# Patient Record
Sex: Female | Born: 1970
Health system: Southern US, Community
[De-identification: ages and names within clinical notes are randomized; demographics above are authoritative.]

## PROBLEM LIST (undated history)

## (undated) DIAGNOSIS — E063 Autoimmune thyroiditis: Secondary | ICD-10-CM

## (undated) DIAGNOSIS — F419 Anxiety disorder, unspecified: Secondary | ICD-10-CM

## (undated) DIAGNOSIS — J45909 Unspecified asthma, uncomplicated: Secondary | ICD-10-CM

## (undated) DIAGNOSIS — F32A Depression, unspecified: Secondary | ICD-10-CM

## (undated) DIAGNOSIS — T7840XA Allergy, unspecified, initial encounter: Secondary | ICD-10-CM

## (undated) DIAGNOSIS — F329 Major depressive disorder, single episode, unspecified: Secondary | ICD-10-CM

## (undated) HISTORY — DX: Allergy, unspecified, initial encounter: T78.40XA

## (undated) HISTORY — PX: ANKLE SURGERY: SHX546

## (undated) HISTORY — DX: Anxiety disorder, unspecified: F41.9

## (undated) HISTORY — DX: Major depressive disorder, single episode, unspecified: F32.9

## (undated) HISTORY — DX: Unspecified asthma, uncomplicated: J45.909

## (undated) HISTORY — DX: Depression, unspecified: F32.A

## (undated) HISTORY — DX: Autoimmune thyroiditis: E06.3

---

## 2008-09-04 ENCOUNTER — Emergency Department (HOSPITAL_COMMUNITY): Admission: EM | Admit: 2008-09-04 | Discharge: 2008-09-04 | Payer: Self-pay | Admitting: Emergency Medicine

## 2009-09-07 IMAGING — CR DG ANKLE COMPLETE 3+V*L*
3 series · 3 of 3 positions shown · non-contrast
Comparison: None

CLINICAL DATA: Fall with left ankle injury and pain.

LEFT ANKLE COMPLETE - 3+ VIEW

[view not recorded (1 of 3)]
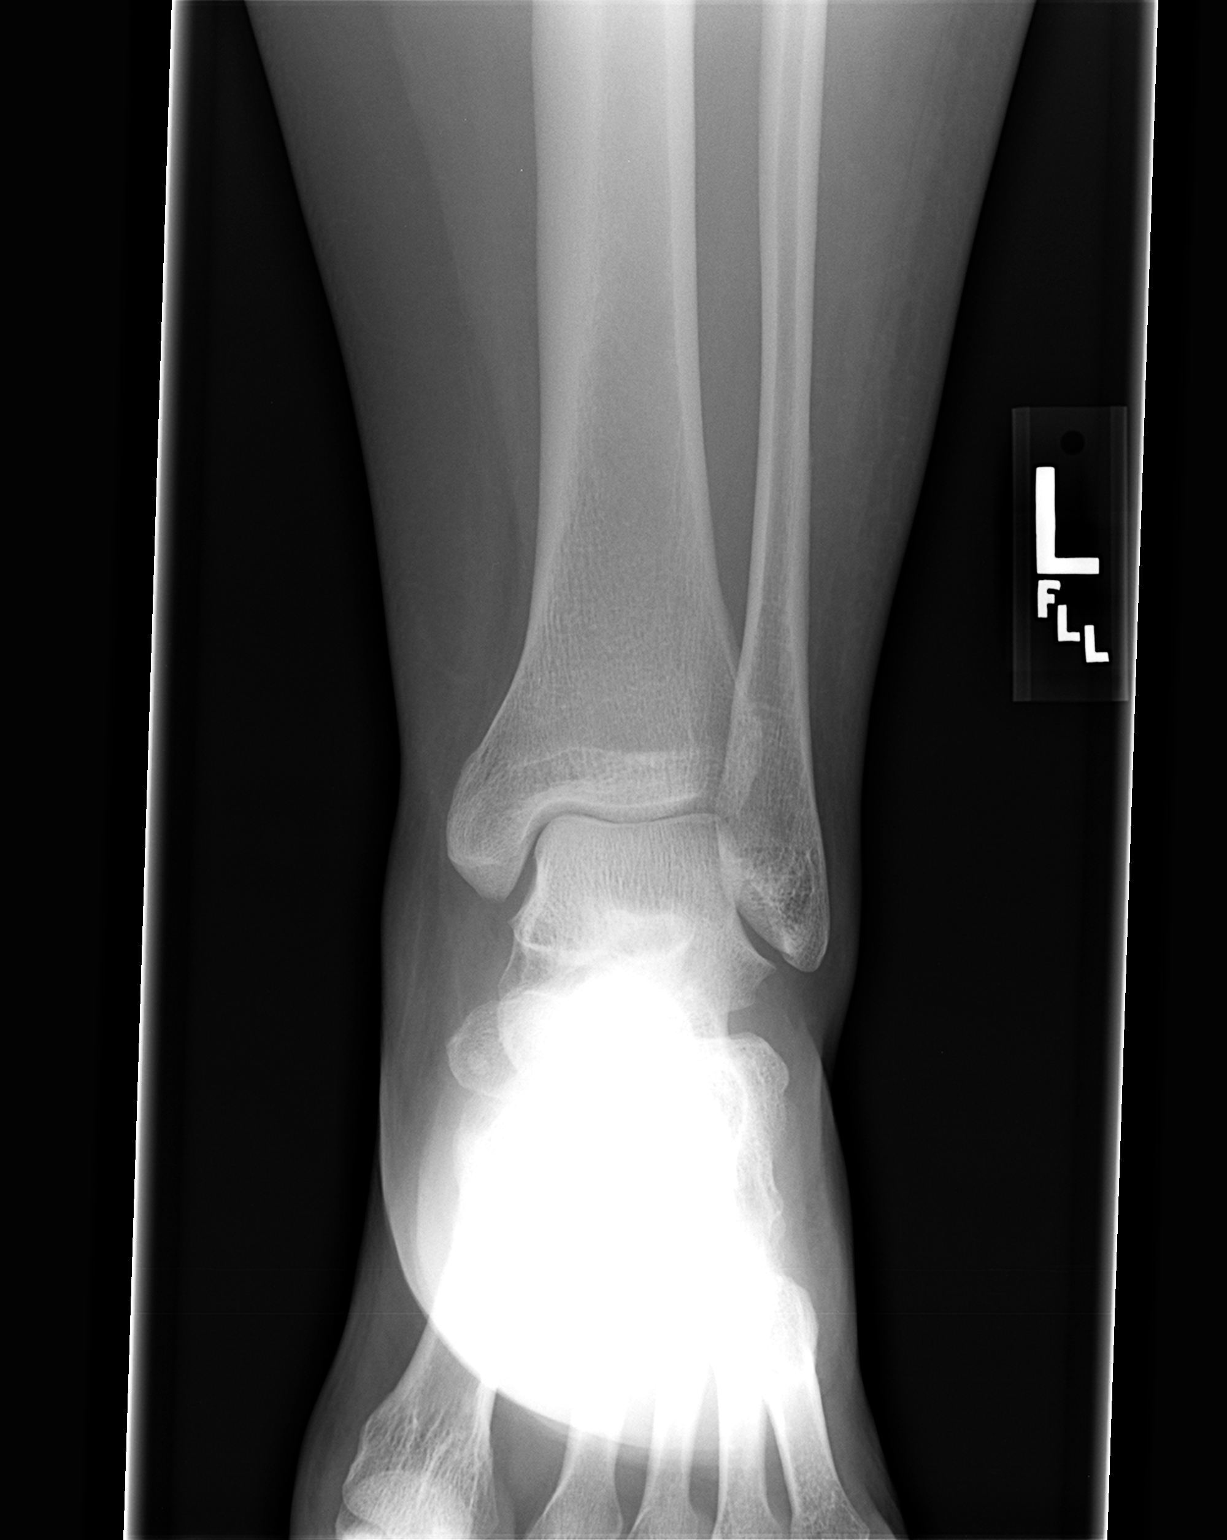

[view not recorded (2 of 3)]
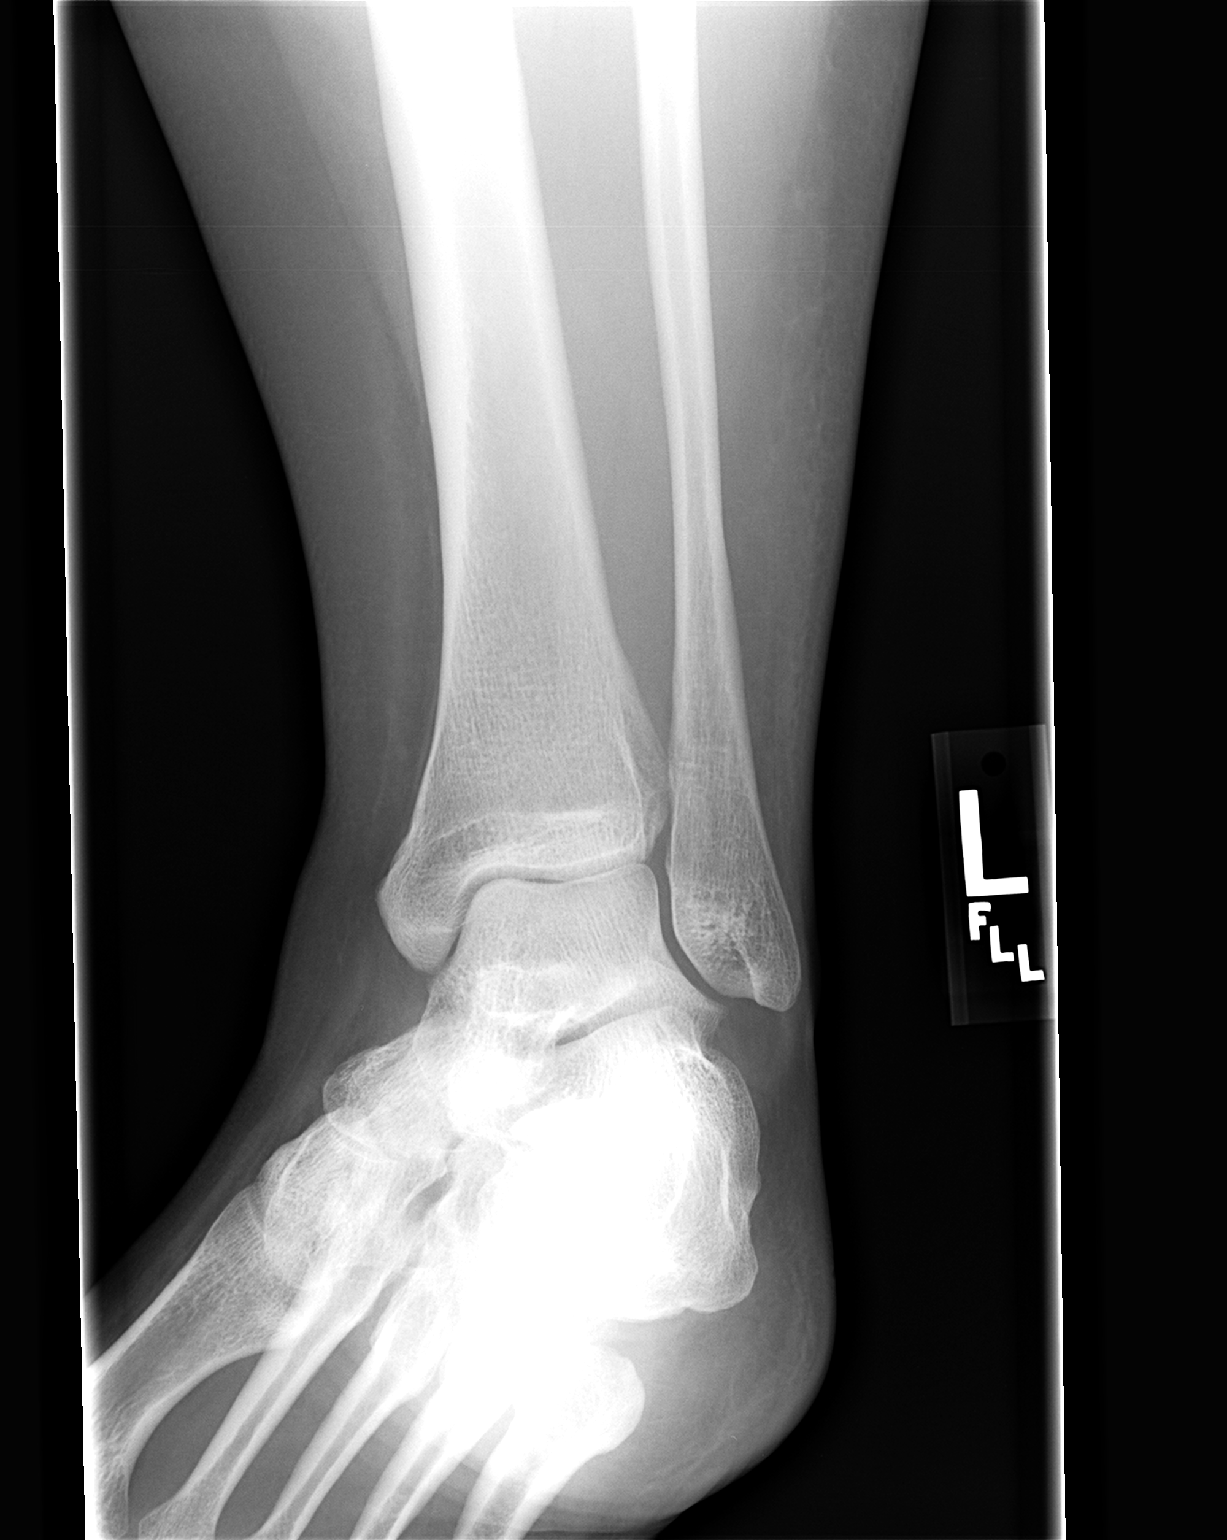

[view not recorded (3 of 3)]
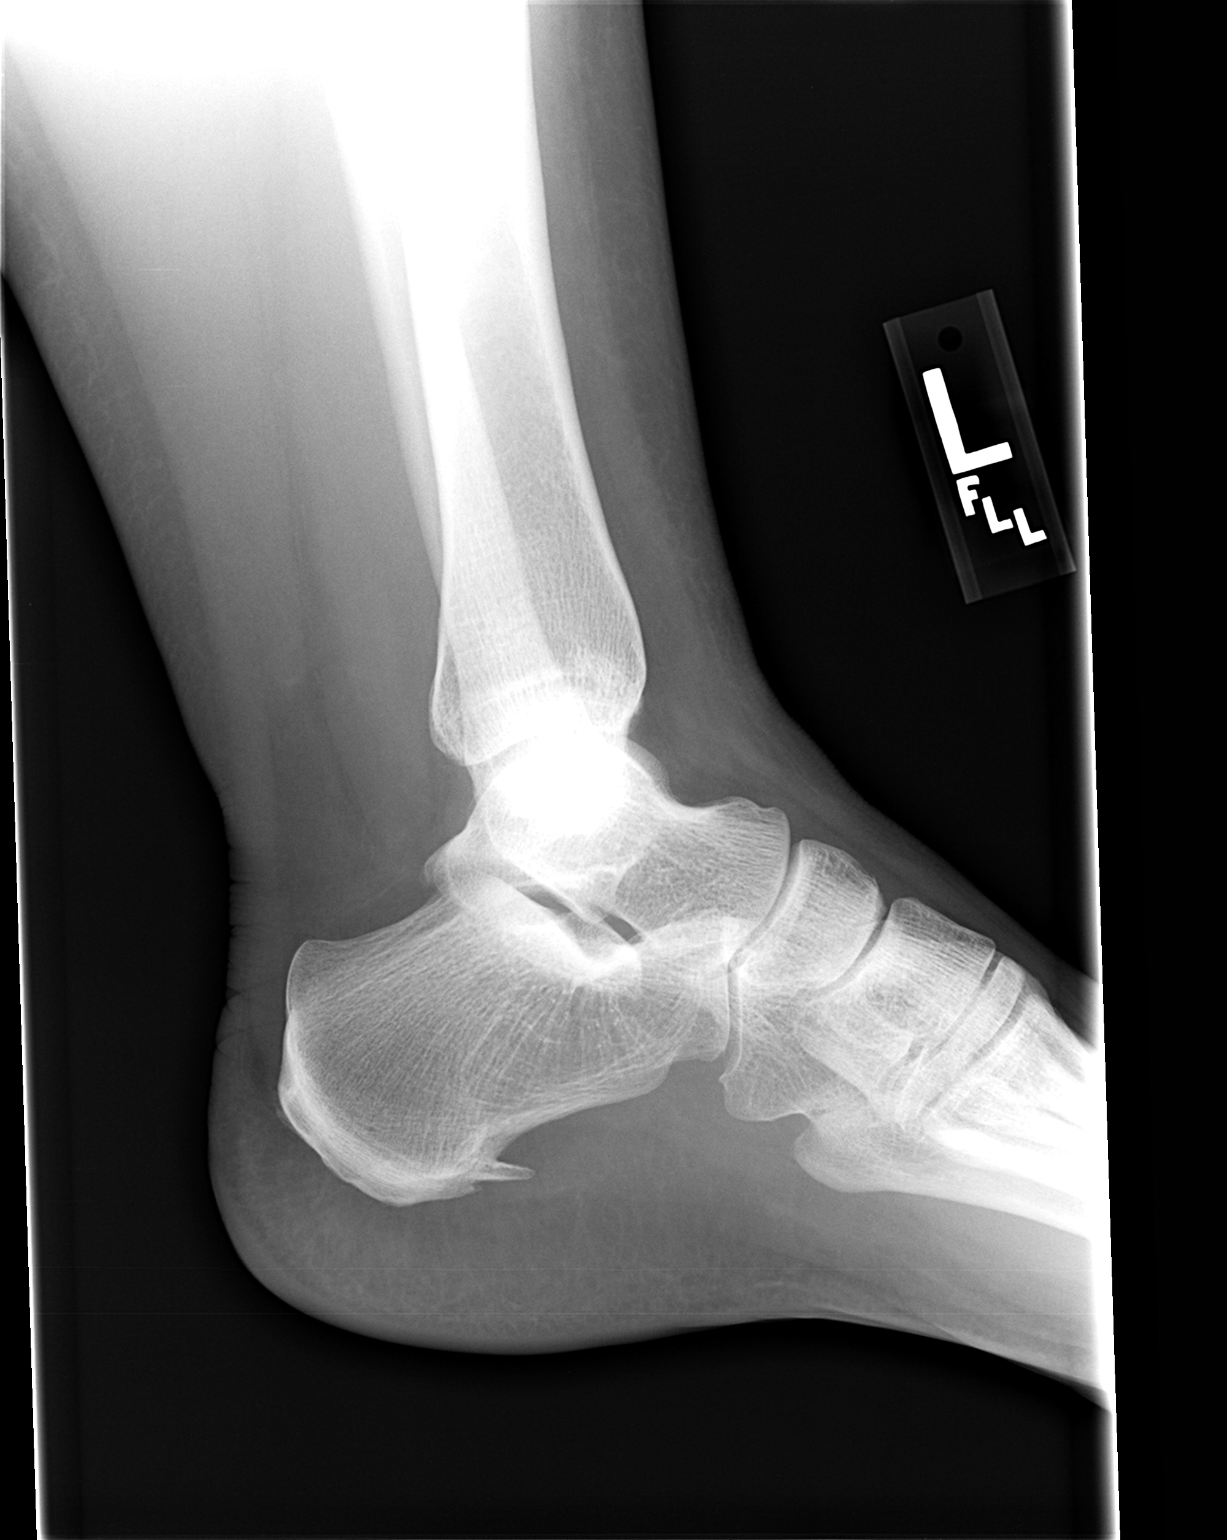

[3 of 3 positions shown; findings below may reference images not displayed]

FINDINGS: No evidence of acute bony abnormality including fracture,
subluxation, or dislocation.
There is no evidence of ankle effusion.
No radiopaque foreign bodies are noted.
A calcaneal spur is identified.
IMPRESSION: No evidence of acute bony abnormality.

Calcaneal spur.

## 2015-08-07 HISTORY — PX: OOPHORECTOMY: SHX86

## 2016-07-31 ENCOUNTER — Ambulatory Visit: Payer: No Typology Code available for payment source | Attending: Internal Medicine | Admitting: Physical Therapy

## 2016-07-31 ENCOUNTER — Encounter: Payer: Self-pay | Admitting: Physical Therapy

## 2016-07-31 DIAGNOSIS — M722 Plantar fascial fibromatosis: Secondary | ICD-10-CM | POA: Diagnosis present

## 2016-07-31 DIAGNOSIS — M6281 Muscle weakness (generalized): Secondary | ICD-10-CM | POA: Diagnosis not present

## 2016-07-31 DIAGNOSIS — R200 Anesthesia of skin: Secondary | ICD-10-CM | POA: Insufficient documentation

## 2016-07-31 DIAGNOSIS — R278 Other lack of coordination: Secondary | ICD-10-CM | POA: Insufficient documentation

## 2016-07-31 NOTE — Patient Instructions (Signed)
You are now ready to begin training the deep core muscles system: diaphragm, transverse abdominis, pelvic floor . These muscles must work together as a team.           The key to these exercises to train the brain to coordinate the timing of these muscles and to have them turn on for long periods of time to hold you upright against gravity (especially important if you are on your feet all day).These muscles are postural muscles and play a role stabilizing your spine and bodyweight. By doing these repetitions slowly and correctly instead of doing crunches, you will achieve a flatter belly without a lower pooch. You are also placing your spine in a more neutral position and breathing properly which in turn, decreases your risk for problems related to your pelvic floor, abdominal, and low back such as pelvic organ prolapse, hernias, diastasis recti (separation of superficial muscles), disk herniations, spinal fractures. These exercises set a solid foundation for you to later progress to resistance/ strength training with therabands and weights and return to other typical fitness exercises with a stronger deeper core.   Do level 1 : 10 reps Do level 2: 10 reps (left and right = 1 rep) x 3 sets , 2 x day Do not progress to level 3 for 3-4 weeks. You know you are ready when you do not have any rocking of pelvis nor arching in your back        Proper body mechanics with getting out of a chair to decrease strain  on back &pelvic floor   Avoid holding your breath when Getting out of the chair:  Scoot to front part of chair Heels behind feet, feet are hip width apart, nose over toes  Inhale like you are smelling roses Exhale to stand

## 2016-07-31 NOTE — Therapy (Signed)
[facility] MAIN [facility] SERVICES [address] [place], [place], [address] Phone: [phone]   Fax:  [phone]  Physical Therapy Evaluation  Patient Details  Name: Molly Bright MRN: [id number] Date of Birth: 1970-10-18 Referring Provider: Dr. Dimas Aguas  Encounter Date: 07/31/2016      PT End of Session - 07/31/16 2017    Visit Number 1   Number of Visits 13   Date for PT Re-Evaluation 10/23/16   Authorization Type eval + 13 visits    PT Start Time 1500   PT Stop Time 1600   PT Time Calculation (min) 60 min   Activity Tolerance Patient tolerated treatment well;No increased pain   Behavior During Therapy WFL for tasks assessed/performed      Past Medical History:  Diagnosis Date  . Allergy   . Anxiety   . Asthma   . Depression     Past Surgical History:  Procedure Laterality Date  . ANKLE SURGERY     tendon repair with incison in L calf   . CESAREAN SECTION     2x  . OOPHORECTOMY Left    removed due to torsion with benign mass    There were no vitals filed for this visit.       Subjective Assessment - 07/31/16 1512    Subjective 1) urinary incontinence: Pt was referred to PT for urinary incontinence which she has noticed over the past 5-7 months. Pt has noticed it worsening. SUI, urge incontinence. Frequency once every 2 hours. Daily fluid intake: water 3 (16 floz), tea 3 (16 fl oz),  18 fl oz (coffee).  2) constipation: pt reports her hypothyroid and Hashimoto disease / medication have caused her to have once every other day. Sometimes straining. Bristol Stool Type 3. Denied low back pain except for sleeping long periods with knees together.  3) pt also was referred for falls prevention. pt had 2 falls across 2 months. Falls occured with legs"slide out from under her as she stepped down from a step" and the second time, she could not remember because she blacked out.      Pertinent History Hx of LLE injury: Pt injured her L leg  from running when she was in the [employer] 20 years ago. She received a cortisone shot and the needle paralyzed the scieatic nerve which caused weakness/ loss of sensation andfeeling/ poor circulation in the L leg. Needles and pins with sitting/standing more > 30 min.  Pt also had a fall 2010 required L ankle repair of tendon, involved incision of L calf. Currently the ankle only will swell with long periods of standing and sitting but no pain.  Hx of plantar fasciitis    Patient Stated Goals to improve leakage            [facility] PT Assessment - 07/31/16 1529      Assessment   Medical Diagnosis Incontience   Referring Provider Dr. Dimas Aguas     Precautions   Precautions None     Restrictions   Weight Bearing Restrictions No     Balance Screen   Has the patient fallen in the past 6 months Yes   How many times? 2   Has the patient had a decrease in activity level because of a fear of falling?  No   Is the patient reluctant to leave their home because of a fear of falling?  No     Home Tourist information centre manager residence  Prior Function   Level of Independence Independent     Observation/Other Assessments   Other Surveys  --  PFDI 45%      Coordination   Gross Motor Movements are Fluid and Coordinated --  limited lateral diaphragmatic excursion   Fine Motor Movements are Fluid and Coordinated --  abdominal strain with cue for pelvic floor mm      Sit to Stand   Comments poor alignment, breathholding     Posture/Postural Control   Posture Comments lumbopelvic perturbation with leg movements     Strength   Overall Strength Comments Hip abd 4+/5, B      Palpation   SI assessment  ASIS symmetrical    Palpation comment abdominal scar restricted medial aspect      Ambulation/Gait   Assistive device Straight cane   Gait Pattern Step-through pattern  heel striking, posterior COM   Gait velocity 0.77 m/s                  Pelvic Floor Special  Questions - 07/31/16 2014    Diastasis Recti neg   External Perineal Exam pt consented, palpation through clothing   External Palpation pelvic floor activation with gluts/ abdominals/ adductors   Pelvic Floor Internal Exam to be assessed at next session          [facility] Adult PT Treatment/Exercise - 07/31/16 1529      Therapeutic Activites    Therapeutic Activities --  see pt instructions     Neuro Re-ed    Neuro Re-ed Details  see pt instructions                PT Education - 07/31/16 2016    Education provided Yes   Education Details POC, anatomy, physiology, goals, HEP   Person(s) Educated Patient   Methods Explanation;Demonstration;Tactile cues;Verbal cues;Handout   Comprehension Verbalized understanding;Returned demonstration;Verbal cues required             PT Long Term Goals - 07/31/16 1526      PT LONG TERM GOAL #1   Title Pt will decrease her PFDI score from 45% to   < 40 % in order to improve pelvic floor function    Time 13   Period Weeks   Status New     PT LONG TERM GOAL #2   Title Pt will decrease her bladder irritants -water ratio from 4:3 to 1:4  in order to promote bladder health   Time 13   Period Weeks   Status New     PT LONG TERM GOAL #3   Title Pt will report improved stool consistency of Type 3 to 4 for 75% of the time in order to promote less straining with bowel movements   Time 13   Period Weeks   Status New     PT LONG TERM GOAL #4   Title Pt will increase her gait speed from 0.77 m/s to > 1.2 m/s with cane in order to ambulate safely in the community with decreased risk of falls.    Time 13   Period Weeks   Status New     PT LONG TERM GOAL #5   Title Pt will demo less heel strike and more anterior COM without cuing to improve gait    Time 13   Period Weeks   Status New     Additional Long Term Goals   Additional Long Term Goals Yes     PT LONG TERM GOAL #6  Title Pt will demo decreased scar restrictions over her  abdomen in order to improve deep core coordination/ mobility to progress to  pelvic floor strengthening and decrease urinary leakage    Time 13   Period Weeks   Status New               Plan - 07/31/16 1526    Clinical Impression Statement Pt is a 46 yo female who reports mixed  urinary leakage, constipation, and history of falls. Pt's urinary symptoms  are her primary  concern while her MD is also concerned about her Hx of  falls (2x within the past 2 months).  Pt's urinary Sx impact her work and  QOL. Pt's clinical presentations include abdominal scar restrictions,  limited diaphragmatic excursion, dyscoordination of deep core mm,  slow  gait, and poor body mechanics. Pt uses a cane currently due to falls. Pt  has had LLE weakness/ numbness and tingling for over 20 years following an  injury.  Further assessment of fall risks and L leg/ankle/plantar  fasciitis issues will be performed at future visits and a regional interdependence approach will be included into her POC. Following session today, pt  demo'd proper breathing coordination with pelvic floor ROM and voiced  understanding on how to not bear down on her pelvic floor with toileting  and sit to stand t/f.        Rehab Potential Good   PT Frequency 1x / week   PT Duration Other (comment)  13 weeks   PT Treatment/Interventions Manual lymph drainage;Scar mobilization;Neuromuscular re-education;Balance training;Therapeutic exercise;Therapeutic activities;Aquatic Therapy;Moist Heat;Traction;Taping;Energy conservation;Stair training;Gait training;Patient/family education;Functional mobility training;ADLs/Self Care Home Management   Consulted and Agree with Plan of Care Patient      Patient will benefit from skilled therapeutic intervention in order to improve the following deficits and impairments:  Abnormal gait, Improper body mechanics, Impaired sensation, Increased muscle spasms, Decreased mobility, Decreased endurance,  Decreased range of motion, Decreased scar mobility, Decreased coordination, Decreased safety awareness, Decreased activity tolerance, Decreased strength, Increased fascial restricitons, Postural dysfunction, Pain, Difficulty walking, Decreased balance  Visit Diagnosis: Muscle weakness (generalized) - Plan: PT plan of care cert/re-cert  Other lack of coordination - Plan: PT plan of care cert/re-cert  Left leg numbness - Plan: PT plan of care cert/re-cert  Plantar fasciitis - Plan: PT plan of care cert/re-cert     Problem List There are no active problems to display for this patient.   Mariane Masters 07/31/2016, 9:25 PM  [place] [facility] MAIN [facility] SERVICES [address] [place], [place], [address] Phone: [phone]   Fax:  [phone]  Name: KAITLAN BIN MRN: [id number] Date of Birth: 14-Aug-1970

## 2016-08-07 ENCOUNTER — Ambulatory Visit: Payer: No Typology Code available for payment source | Attending: Internal Medicine | Admitting: Physical Therapy

## 2016-08-07 DIAGNOSIS — R278 Other lack of coordination: Secondary | ICD-10-CM | POA: Insufficient documentation

## 2016-08-07 DIAGNOSIS — R200 Anesthesia of skin: Secondary | ICD-10-CM | POA: Diagnosis present

## 2016-08-07 DIAGNOSIS — M6281 Muscle weakness (generalized): Secondary | ICD-10-CM | POA: Insufficient documentation

## 2016-08-07 DIAGNOSIS — M722 Plantar fascial fibromatosis: Secondary | ICD-10-CM | POA: Insufficient documentation

## 2016-08-07 NOTE — Therapy (Addendum)
[place] MAIN [facility] SERVICES [address] [place], [place], [address] Phone: [phone]   Fax:  [phone]  Physical Therapy Treatment  Patient Details  Name: Molly Bright MRN: [id number] Date of Birth: November 07, 1970 Referring Provider: Dr. Nadara Mustard  Encounter Date: 08/07/2016      PT End of Session - 08/15/16 1448    Visit Number 2   Number of Visits 13   Date for PT Re-Evaluation 10/23/16   Authorization Type 2/13 visits    Activity Tolerance Patient tolerated treatment well;No increased pain   Behavior During Therapy WFL for tasks assessed/performed      Past Medical History:  Diagnosis Date  . Allergy   . Anxiety   . Asthma   . Depression     Past Surgical History:  Procedure Laterality Date  . ANKLE SURGERY     tendon repair with incison in L calf   . CESAREAN SECTION     2x  . OOPHORECTOMY Left    removed due to torsion with benign mass    There were no vitals filed for this visit.      Subjective Assessment - 08/15/16 1447    Subjective Pt reported she has been practicing her breathing technique with getting out of the chair but is still trying to practice log rolling.    Pertinent History Hx of LLE injury: Pt injured her L leg from running when she was in the [employer] 20 years ago. She received a cortisone shot and the needle paralyzed the scieatic nerve which caused weakness/ loss of sensation andfeeling/ poor circulation in the L leg. Needles and pins with sitting/standing more > 30 min.  Pt also had a fall 2010 required L ankle repair of tendon, involved incision of L calf. Currently the ankle only will swell with long periods of standing and sitting but no pain.  Hx of plantar fasciitis    Patient Stated Goals to improve leakage            [facility] PT Assessment - 08/15/16 1447      Palpation   Palpation comment medial aspect of the scar with increased tensions (decreased post Tx)                    Pelvic Floor Special Questions - 08/15/16 1451    Pelvic Floor Internal Exam pt consented verbally, without contraindications   Exam Type Vaginal   Palpation increased tensions at iliococcygeus/ obt int R > L (decreased post Tx)    Strength Flicker   Strength # of reps --  achieved proper ROM            [facility] Adult PT Treatment/Exercise - 08/15/16 1447      Therapeutic Activites    Therapeutic Activities --  see pt instructions     Neuro Re-ed    Neuro Re-ed Details  see pt instructions     Manual Therapy   Internal Pelvic Floor MET, MWM at R obturator internus, iliococcygeus   scar release along low abdomen                     PT Long Term Goals - 08/15/16 1443      PT LONG TERM GOAL #1   Title Pt will decrease her PFDI score from 45% to   < 40 % in order to improve pelvic floor function    Time 13   Period Weeks   Status On-going  PT LONG TERM GOAL #2   Title Pt will decrease her bladder irritants -water ratio from 4:3 to 1:4  in order to promote bladder health   Time 13   Period Weeks   Status Partially Met     PT LONG TERM GOAL #3   Title Pt will report improved stool consistency of Type 3 to 4 for 75% of the time in order to promote less straining with bowel movements   Time 13   Period Weeks   Status On-going     PT LONG TERM GOAL #4   Title Pt will increase her gait speed from 0.77 m/s to > 1.2 m/s with cane in order to ambulate safely in the community with decreased risk of falls.    Time 13   Period Weeks   Status On-going     PT LONG TERM GOAL #5   Title Pt will demo less heel strike and more anterior COM without cuing to improve gait    Time 13   Period Weeks   Status On-going     PT LONG TERM GOAL #6   Title Pt will demo decreased scar restrictions over her abdomen in order to improve deep core coordination/ mobility to progress to  pelvic floor strengthening and decrease urinary leakage    Time 13   Period Weeks   Status  On-going               Plan - 08/15/16 1448    Clinical Impression Statement Pt achieved increased pelvic floor ROM following manual Tx that addressed tight pelvic floor mm on R and increased abdominal scar restrictions. Pt progressed to deep core level 2. Imblanace of pelvic floor tightness is likely due to increased weighbearing on RLE 2/2 L LE issues. Plan to address LLE issues in future sessions. Address bladder irritant to water ratio at next session. Pt continues to benefit from skilled PT.    Rehab Potential Good   PT Frequency 1x / week   PT Duration Other (comment)  13 weeks   PT Treatment/Interventions Manual lymph drainage;Scar mobilization;Neuromuscular re-education;Balance training;Therapeutic exercise;Therapeutic activities;Aquatic Therapy;Moist Heat;Traction;Taping;Energy conservation;Stair training;Gait training;Patient/family education;Functional mobility training;ADLs/Self Care Home Management   Consulted and Agree with Plan of Care Patient      Patient will benefit from skilled therapeutic intervention in order to improve the following deficits and impairments:  Abnormal gait, Improper body mechanics, Impaired sensation, Increased muscle spasms, Decreased mobility, Decreased endurance, Decreased range of motion, Decreased scar mobility, Decreased coordination, Decreased safety awareness, Decreased activity tolerance, Decreased strength, Increased fascial restricitons, Postural dysfunction, Pain, Difficulty walking, Decreased balance  Visit Diagnosis: Muscle weakness (generalized)  Other lack of coordination  Left leg numbness  Plantar fasciitis       G-Codes - Sep 04, 2016 1833    Functional Assessment Tool Used (Outpatient Only) PFDI score 45%   Functional Limitation Self care   Self Care Current Status (I9022) At least 40 percent but less than 60 percent impaired, limited or restricted   Self Care Goal Status (M4069) At least 20 percent but less than 40  percent impaired, limited or restricted      Problem List There are no active problems to display for this patient.   Jerl Mina ,PT, DPT, E-RYT  08/15/2016, 2:51 PM  [place] MAIN [facility] SERVICES [address] [place], [place], [address] Phone: [phone]   Fax:  [phone]  Name: Molly Bright MRN: [id number] Date of Birth: 1970-06-01

## 2016-08-07 NOTE — Patient Instructions (Addendum)
You are now ready to begin training the deep core muscles system: diaphragm, transverse abdominis, pelvic floor . These muscles must work together as a team.           The key to these exercises to train the brain to coordinate the timing of these muscles and to have them turn on for long periods of time to hold you upright against gravity (especially important if you are on your feet all day).These muscles are postural muscles and play a role stabilizing your spine and bodyweight. By doing these repetitions slowly and correctly instead of doing crunches, you will achieve a flatter belly without a lower pooch. You are also placing your spine in a more neutral position and breathing properly which in turn, decreases your risk for problems related to your pelvic floor, abdominal, and low back such as pelvic organ prolapse, hernias, diastasis recti (separation of superficial muscles), disk herniations, spinal fractures. These exercises set a solid foundation for you to later progress to resistance/ strength training with therabands and weights and return to other typical fitness exercises with a stronger deeper core.   Do level 1 : 10 reps (notice how the pelvic floor can expand with inhale, gently lift with exhale)  Do level 2: 10 reps (left and right = 1 rep) x 3 sets , 2 x day Do not progress to level 3 for 3-4 weeks. You know you are ready when you do not have any rocking of pelvis nor arching in your back    __________________ Stretch:  Figure 4 stretch  __________________  [address] zag scar massage (gentle along low abdomen)  5 min with organic olive oil or coconut

## 2016-08-15 ENCOUNTER — Ambulatory Visit: Payer: No Typology Code available for payment source | Admitting: Physical Therapy

## 2016-08-15 DIAGNOSIS — M722 Plantar fascial fibromatosis: Secondary | ICD-10-CM

## 2016-08-15 DIAGNOSIS — R278 Other lack of coordination: Secondary | ICD-10-CM

## 2016-08-15 DIAGNOSIS — M6281 Muscle weakness (generalized): Secondary | ICD-10-CM

## 2016-08-15 DIAGNOSIS — R200 Anesthesia of skin: Secondary | ICD-10-CM

## 2016-08-15 NOTE — Patient Instructions (Signed)
Resistance band walking  Back ward for forward  10reps each facing door and away from door   Keep center of mass over shin bones, land on more middle foot/ forefoot    _________  Continue with deep core level 1 and 2 with more pelvic floor lengthening

## 2016-08-16 NOTE — Therapy (Signed)
[place] MAIN [facility] SERVICES [address] [place], [place], [address] Phone: [phone]   Fax:  [phone]  Physical Therapy Treatment  Patient Details  Name: [name] MRN: [id number] Date of Birth: 07-23-1970 Referring Provider: Dr. Nadara Mustard  Encounter Date: 08/15/2016      PT End of Session - 08/16/16 2116    Visit Number 3   Number of Visits 13   Date for PT Re-Evaluation 10/23/16   Authorization Type 3/13 visits    PT Start Time 1435   PT Stop Time 1525   PT Time Calculation (min) 50 min   Activity Tolerance Patient tolerated treatment well;No increased pain   Behavior During Therapy WFL for tasks assessed/performed      Past Medical History:  Diagnosis Date  . Allergy   . Anxiety   . Asthma   . Depression     Past Surgical History:  Procedure Laterality Date  . ANKLE SURGERY     tendon repair with incison in L calf   . CESAREAN SECTION     2x  . OOPHORECTOMY Left    removed due to torsion with benign mass    There were no vitals filed for this visit.      Subjective Assessment - 08/16/16 2114    Subjective Pt reported soreness in her L leg and L ankle with the HEP.    Pertinent History Hx of LLE injury: Pt injured her L leg from running when she was in the [employer] 20 years ago. She received a cortisone shot and the needle paralyzed the scieatic nerve which caused weakness/ loss of sensation andfeeling/ poor circulation in the L leg. Needles and pins with sitting/standing more > 30 min.  Pt also had a fall 2010 required L ankle repair of tendon, involved incision of L calf. Currently the ankle only will swell with long periods of standing and sitting but no pain.  Hx of plantar fasciitis    Patient Stated Goals to improve leakage            [facility] PT Assessment - 08/15/16 1522      Posture/Postural Control   Posture Comments cued for less movement and more stability in deep core level 2  forward and  backward      Ambulation/Gait   Gait Comments demo'd more anterior COM with cues during resistance band walking                   Pelvic Floor Special Questions - 08/16/16 2116    Pelvic Floor Internal Exam pt consented verbally, without contraindications   Exam Type Vaginal   Palpation no tensions at iliococcygeus/ obt int R > L .  limited mobilityi n urogenital triangle, increased ROM post Tx   Strength Flicker   Strength # of reps --  achieved proper ROM circumferentially, minimal contraction            [facility] Adult PT Treatment/Exercise - 08/16/16 2115      Ambulation/Gait   Gait Comments demo'd more anterior COM with cues during resistance band walking      Posture/Postural Control   Posture Comments cued for less movement and more stability in deep core level 2  forward and backward      Therapeutic Activites    Therapeutic Activities --  see pt instructions     Neuro Re-ed    Neuro Re-ed Details  see pt instructions     Manual Therapy  Internal Pelvic Floor facilitated all muscle layers to elicit more lengthening with circumferential coordination  scar release along low abdomen                PT Education - 08/16/16 2116    Education provided Yes   Education Details HEP   Person(s) Educated Patient   Methods Explanation;Demonstration;Tactile cues;Verbal cues;Handout   Comprehension Returned demonstration;Verbalized understanding             PT Long Term Goals - 08/15/16 1443      PT LONG TERM GOAL #1   Title Pt will decrease her PFDI score from 45% to   < 40 % in order to improve pelvic floor function    Time 13   Period Weeks   Status On-going     PT LONG TERM GOAL #2   Title Pt will decrease her bladder irritants -water ratio from 4:3 to 1:4  in order to promote bladder health   Time 13   Period Weeks   Status Partially Met     PT LONG TERM GOAL #3   Title Pt will report improved stool consistency of Type 3 to 4 for 75%  of the time in order to promote less straining with bowel movements   Time 13   Period Weeks   Status On-going     PT LONG TERM GOAL #4   Title Pt will increase her gait speed from 0.77 m/s to > 1.2 m/s with cane in order to ambulate safely in the community with decreased risk of falls.    Time 13   Period Weeks   Status On-going     PT LONG TERM GOAL #5   Title Pt will demo less heel strike and more anterior COM without cuing to improve gait    Time 13   Period Weeks   Status On-going     PT LONG TERM GOAL #6   Title Pt will demo decreased scar restrictions over her abdomen in order to improve deep core coordination/ mobility to progress to  pelvic floor strengthening and decrease urinary leakage    Time 13   Period Weeks   Status On-going               Plan - 08/16/16 2116    Clinical Impression Statement Pt demo'd good carry over with decreased pelvic floor mm tensions from last session  but required facilitation of urogential mm to elicit a more circumferential excursion of pelvic floor diaphragm. Pt required corrections with previous HEP. Initiated gait training to co-activate the deep core mm. Pt continues to benefit from skilled PT.    Rehab Potential Good   PT Frequency 1x / week   PT Duration Other (comment)  13 weeks   PT Treatment/Interventions Manual lymph drainage;Scar mobilization;Neuromuscular re-education;Balance training;Therapeutic exercise;Therapeutic activities;Aquatic Therapy;Moist Heat;Traction;Taping;Energy conservation;Stair training;Gait training;Patient/family education;Functional mobility training;ADLs/Self Care Home Management   Consulted and Agree with Plan of Care Patient      Patient will benefit from skilled therapeutic intervention in order to improve the following deficits and impairments:  Abnormal gait, Improper body mechanics, Impaired sensation, Increased muscle spasms, Decreased mobility, Decreased endurance, Decreased range of motion,  Decreased scar mobility, Decreased coordination, Decreased safety awareness, Decreased activity tolerance, Decreased strength, Increased fascial restricitons, Postural dysfunction, Difficulty walking, Decreased balance  Visit Diagnosis: Muscle weakness (generalized)  Other lack of coordination  Left leg numbness  Plantar fasciitis     Problem List There are no active problems to display  for this patient.   Jerl Mina ,PT, DPT, E-RYT  08/16/2016, 9:18 PM  [place] MAIN [facility] SERVICES [address] [place], [place], [address] Phone: [phone]   Fax:  [phone]  Name: Molly Bright MRN: [id number] Date of Birth: 17-Nov-1970

## 2016-08-21 ENCOUNTER — Ambulatory Visit: Payer: No Typology Code available for payment source | Admitting: Physical Therapy

## 2016-08-21 DIAGNOSIS — R278 Other lack of coordination: Secondary | ICD-10-CM

## 2016-08-21 DIAGNOSIS — M6281 Muscle weakness (generalized): Secondary | ICD-10-CM

## 2016-08-21 DIAGNOSIS — M722 Plantar fascial fibromatosis: Secondary | ICD-10-CM

## 2016-08-21 DIAGNOSIS — R200 Anesthesia of skin: Secondary | ICD-10-CM

## 2016-08-21 NOTE — Therapy (Signed)
[place] MAIN [facility] SERVICES [address] [place], [place], [address] Phone: [phone]   Fax:  [phone]  Physical Therapy Treatment  Patient Details  Name: Molly Bright MRN: [id number] Date of Birth: 07-06-1970 Referring Provider: Dr. Nadara Mustard  Encounter Date: 08/21/2016      PT End of Session - 08/21/16 1155    Visit Number 4   Number of Visits 13   Date for PT Re-Evaluation 10/23/16   Authorization Type 4/13 visits    PT Start Time 1107   PT Stop Time 1205   PT Time Calculation (min) 58 min   Activity Tolerance Patient tolerated treatment well;No increased pain   Behavior During Therapy WFL for tasks assessed/performed      Past Medical History:  Diagnosis Date  . Allergy   . Anxiety   . Asthma   . Depression     Past Surgical History:  Procedure Laterality Date  . ANKLE SURGERY     tendon repair with incison in L calf   . CESAREAN SECTION     2x  . OOPHORECTOMY Left    removed due to torsion with benign mass    There were no vitals filed for this visit.      Subjective Assessment - 08/21/16 1120    Subjective (P)  Pt feels her ankle is somewhat sore and not like what it was before. Improvement is at 30%. Today, the soreness (7/10)  in her ankle but not the hip. Pt also notices she can control her bladder when she has the urge. Leakage does not happen as bad as it used to.             [facility] PT Assessment - 08/21/16 1149      AROM   Overall AROM Comments DF  WFL Bilaterally       PROM   Overall PROM Comments DF  WFL Bilaterally ( L end range limited pre Tx, post Tx increased )        Strength   Overall Strength Comments R 4/5 ankle eversion, L 3/5 pre Tx, 4/5 post Tx .   Toe flexion on L 3/5 (post Tx 4/5)       Palpation   Palpation comment increased tensions along medial aspect of abdominal scar and L inferior peroneal retinaculum ( decreased post Tx)                      [facility] Adult  PT Treatment/Exercise - 08/21/16 1152      Therapeutic Activites    Therapeutic Activities --  see pt instructions     Manual Therapy   Manual therapy comments Distraction at Talocrural joint, AP mob at Calcaneus and cuboid, cuboid/ metatarsal V, STM along Metatarsal V lateral border    abdominal scar release                 PT Education - 08/21/16 1155    Education provided Yes   Education Details HEP   Person(s) Educated Patient   Methods Explanation;Demonstration;Tactile cues;Verbal cues;Handout   Comprehension Returned demonstration;Verbalized understanding             PT Long Term Goals - 08/21/16 1116      PT LONG TERM GOAL #1   Title Pt will decrease her PFDI score from 45% to   < 40 % in order to improve pelvic floor function    Time 13   Period Weeks  Status On-going     PT LONG TERM GOAL #2   Title Pt will decrease her bladder irritants -water ratio from 4:3 to 1:4  in order to promote bladder health   Time 13   Period Weeks   Status Achieved     PT LONG TERM GOAL #3   Title Pt will report improved stool consistency of Type 3 to 4 for 75% of the time in order to promote less straining with bowel movements   Time 13   Period Weeks   Status On-going     PT LONG TERM GOAL #4   Title Pt will increase her gait speed from 0.77 m/s to > 1.2 m/s with cane in order to ambulate safely in the community with decreased risk of falls. (4/16: 1.0 m/s)    Time 13   Period Weeks   Status Partially Met     PT LONG TERM GOAL #5   Title Pt will demo less heel strike and more anterior COM without cuing to improve gait    Time 13   Period Weeks   Status Partially Met     PT LONG TERM GOAL #6   Title Pt will demo decreased scar restrictions over her abdomen in order to improve deep core coordination/ mobility to progress to  pelvic floor strengthening and decrease urinary leakage    Time 13   Period Weeks   Status On-going               Plan -  08/21/16 1156    Clinical Impression Statement Pt demo'd less abdominal scar restrictions which will continue to promote pelvic floor ROM and deep core coordination.strength. Pt also demo'd increased ankle eversion/flexion strength on RLE and increased gait speed following manual Tx today. Pt reported resolved soreness from 7/10 to 0/10 level post Tx. Pt continues to benefit from skilled PT.    Rehab Potential Good   PT Frequency 1x / week   PT Duration Other (comment)  13 weeks   PT Treatment/Interventions Manual lymph drainage;Scar mobilization;Neuromuscular re-education;Balance training;Therapeutic exercise;Therapeutic activities;Aquatic Therapy;Moist Heat;Traction;Taping;Energy conservation;Stair training;Gait training;Patient/family education;Functional mobility training;ADLs/Self Care Home Management   Consulted and Agree with Plan of Care Patient      Patient will benefit from skilled therapeutic intervention in order to improve the following deficits and impairments:  Abnormal gait, Improper body mechanics, Impaired sensation, Increased muscle spasms, Decreased mobility, Decreased endurance, Decreased range of motion, Decreased scar mobility, Decreased coordination, Decreased safety awareness, Decreased activity tolerance, Decreased strength, Increased fascial restricitons, Postural dysfunction, Difficulty walking, Decreased balance  Visit Diagnosis: Muscle weakness (generalized)  Other lack of coordination  Left leg numbness  Plantar fasciitis     Problem List There are no active problems to display for this patient.   Jerl Mina ,PT, DPT, E-RYT  08/21/2016, 12:14 PM  [place] MAIN [facility] SERVICES [address] [place], [place], [address] Phone: [phone]   Fax:  [phone]  Name: Molly Bright MRN: [id number] Date of Birth: 03-Oct-1970

## 2016-08-21 NOTE — Patient Instructions (Signed)
Ankle eversion : Band under R foot, wrapped about pinky side of L foot.  Move L ankle to L 10 reps, x 3 / day   Toes exercises:  curl under. Spread out

## 2016-09-05 ENCOUNTER — Ambulatory Visit: Payer: No Typology Code available for payment source | Attending: Internal Medicine | Admitting: Physical Therapy

## 2016-09-05 DIAGNOSIS — R278 Other lack of coordination: Secondary | ICD-10-CM | POA: Diagnosis present

## 2016-09-05 DIAGNOSIS — M722 Plantar fascial fibromatosis: Secondary | ICD-10-CM | POA: Insufficient documentation

## 2016-09-05 DIAGNOSIS — R2689 Other abnormalities of gait and mobility: Secondary | ICD-10-CM | POA: Insufficient documentation

## 2016-09-05 DIAGNOSIS — R200 Anesthesia of skin: Secondary | ICD-10-CM | POA: Insufficient documentation

## 2016-09-05 DIAGNOSIS — M6281 Muscle weakness (generalized): Secondary | ICD-10-CM | POA: Insufficient documentation
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
## 2016-09-06 NOTE — Patient Instructions (Signed)
Continue with deep core level 2 with refined awareness of pelvic floor lengthening   Walking 10 ft x 5 reps  'high thighs" to promote stronger push up into the ground and hip mobility

## 2016-09-12 ENCOUNTER — Ambulatory Visit: Payer: No Typology Code available for payment source | Admitting: Physical Therapy

## 2016-09-12 DIAGNOSIS — M6281 Muscle weakness (generalized): Secondary | ICD-10-CM

## 2016-09-12 DIAGNOSIS — M722 Plantar fascial fibromatosis: Secondary | ICD-10-CM

## 2016-09-12 DIAGNOSIS — R200 Anesthesia of skin: Secondary | ICD-10-CM

## 2016-09-12 DIAGNOSIS — R278 Other lack of coordination: Secondary | ICD-10-CM

## 2016-09-12 NOTE — Therapy (Signed)
[facility] MAIN [facility] SERVICES [address] [place], [place], [address] Phone: [phone]   Fax:  [phone]  Physical Therapy Treatment   Patient Details  Name: Molly Bright MRN: [id number] Date of Birth: [birth year] Referring Provider: Dr. Dimas Aguas  Encounter Date: 09/12/2016      PT End of Session - 09/12/16 1648    Visit Number 6   Number of Visits 13   Date for PT Re-Evaluation 10/23/16   Authorization Type 6/13 visits    PT Start Time 1603   PT Stop Time 1656   PT Time Calculation (min) 53 min   Activity Tolerance Patient tolerated treatment well;No increased pain   Behavior During Therapy WFL for tasks assessed/performed      Past Medical History:  Diagnosis Date  . Allergy   . Anxiety   . Asthma   . Depression     Past Surgical History:  Procedure Laterality Date  . ANKLE SURGERY     tendon repair with incison in L calf   . CESAREAN SECTION     2x  . OOPHORECTOMY Left    removed due to torsion with benign mass    There were no vitals filed for this visit.      Subjective Assessment - 09/12/16 1634    Subjective Pt reported feeling better last session but the tenderness over the top of the L foot and the inside of B arches have not improved nor worsened.     Pertinent History Hx of LLE injury: Pt injured her L leg from running when she was in the [employer] 20 years ago. She received a cortisone shot and the needle paralyzed the scieatic nerve which caused weakness/ loss of sensation andfeeling/ poor circulation in the L leg. Needles and pins with sitting/standing more > 30 min.  Pt also had a fall 2010 required L ankle repair of tendon, involved incision of L calf. Currently the ankle only will swell with long periods of standing and sitting but no pain.  Hx of plantar fasciitis             [facility] PT Assessment - 09/12/16 1637      Palpation   Palpation comment hypomobility along mid foot bones B,  L metatarsal ,  rays B      Ambulation/Gait   Gait Comments pre Tx: limited DF in loading and pre swing, Post Tx: improved and more postural stability to manually applied perturbation                      [facility] Adult PT Treatment/Exercise - 09/12/16 1635      Therapeutic Activites    Therapeutic Activities --  see pt instructions     Neuro Re-ed    Neuro Re-ed Details  see pt instructions: cued for more weight bearing/  in the loading phase/ preswing phase , with increased DF B       Manual Therapy   Manual therapy comments PA/AP mob along bones of midfoot B                  PT Education - 09/12/16 1648    Education provided Yes   Education Details HEP   Person(s) Educated Patient   Methods Explanation;Demonstration;Tactile cues;Verbal cues;Handout   Comprehension Returned demonstration;Verbalized understanding             PT Long Term Goals - 09/12/16 1648      PT LONG TERM  GOAL #1   Title Pt will decrease her PFDI score from 45% to   < 40 % in order to improve pelvic floor function (5/8" 34% )    Time 13   Period Weeks   Status Achieved     PT LONG TERM GOAL #2   Title Pt will decrease her bladder irritants -water ratio from 4:3 to 1:4  in order to promote bladder health   Time 13   Period Weeks   Status Achieved     PT LONG TERM GOAL #3   Title Pt will report improved stool consistency of Type 3 to 4 for 75% of the time in order to promote less straining with bowel movements   Time 13   Period Weeks   Status Achieved     PT LONG TERM GOAL #4   Title Pt will increase her gait speed from 0.77 m/s w/ cane to > 1.2 m/s without cane in order to ambulate safely in the community with decreased risk of falls. (4/16: 1.0 m/s w/ cane, 5/8: 1.1 m/s w/o cane )    Time 13   Period Weeks   Status Revised     PT LONG TERM GOAL #5   Title Pt will demo less heel strike and more anterior COM without cuing to improve gait    Time 13   Period Weeks   Status  Partially Met     PT LONG TERM GOAL #6   Title Pt will demo decreased scar restrictions over her abdomen in order to improve deep core coordination/ mobility to progress to  pelvic floor strengthening and decrease urinary leakage    Time 13   Period Weeks   Status Achieved     PT LONG TERM GOAL #7   Title Pt will report walking up to 30 min without heel nor arch pain in B LE in order to maintain fitness    Time 12   Period Weeks   Status New               Plan - 09/12/16 1652    Clinical Impression Statement Pt is progressing well towards her goals, achieving 4/7   .  Pt's urinary Sx have improved as her PFDI score decreased from 45  % to 34 %. Pt has increased her water intake, decreased her sodas, and has had softer stool types for optimal pelvic health.  Pt's pelvic floor mm tensions have decreased and heel pain also has gradually improved.  Today she walked into the clinic without cane. Pt continues to require manual Tx to improve flexibilty in her feet and gait training to improve weight bearing and flexibility in her hips and feet. Following Tx today, pt reported her heel and arches did not hurt with walking 6 min. Her gait speed has improved without use of a cane following Tx. Pt continues to benefit from skilled PT.    Rehab Potential Good   PT Frequency 1x / week   PT Duration Other (comment)  13 weeks   PT Treatment/Interventions Manual lymph drainage;Scar mobilization;Neuromuscular re-education;Balance training;Therapeutic exercise;Therapeutic activities;Aquatic Therapy;Moist Heat;Traction;Taping;Energy conservation;Stair training;Gait training;Patient/family education;Functional mobility training;ADLs/Self Care Home Management   Consulted and Agree with Plan of Care Patient      Patient will benefit from skilled therapeutic intervention in order to improve the following deficits and impairments:  Abnormal gait, Improper body mechanics, Impaired sensation, Increased  muscle spasms, Decreased mobility, Decreased endurance, Decreased range of motion, Decreased scar mobility, Decreased coordination,  Decreased safety awareness, Decreased activity tolerance, Decreased strength, Increased fascial restricitons, Postural dysfunction, Difficulty walking, Decreased balance  Visit Diagnosis: Muscle weakness (generalized)  Other lack of coordination  Left leg numbness  Plantar fasciitis     Problem List There are no active problems to display for this patient.   Jerl Mina ,PT, DPT, E-RYT  09/12/2016, 4:58 PM  [place] MAIN [facility] SERVICES [address] [place], [place], [address] Phone: [phone]   Fax:  [phone]  Name: HOLY BATTENFIELD MRN: [id number] Date of Birth: Oct 05, 1970

## 2016-09-12 NOTE — Patient Instructions (Addendum)
Feet massage   Laying down: toes point up, let off the gas (dorsiflexion)  10 reps    Standing with red band at L shin, attached to chair leg  modifed lunge, L knee over toes to get more dorsiflexion  R foot hip width apart 10 reps x 2     Slow walking with one hand on wall Modified lunge, back heel flat Front knee over heel , knee bent  Shift navel forward, heel raise,  bend back knee for more dorsiflexion, lower heel Front toes spread wide, ballmounds down to increase arches  4 laps in hallway    Walking with more knee bend, navel over shins/ mid/forefoot  6'   [email or web address]

## 2016-09-19 ENCOUNTER — Ambulatory Visit: Payer: No Typology Code available for payment source | Admitting: Physical Therapy

## 2016-09-19 DIAGNOSIS — M6281 Muscle weakness (generalized): Secondary | ICD-10-CM

## 2016-09-19 DIAGNOSIS — M722 Plantar fascial fibromatosis: Secondary | ICD-10-CM

## 2016-09-19 DIAGNOSIS — R200 Anesthesia of skin: Secondary | ICD-10-CM

## 2016-09-19 DIAGNOSIS — R278 Other lack of coordination: Secondary | ICD-10-CM

## 2016-09-19 NOTE — Therapy (Signed)
[place] MAIN [facility] SERVICES [address] [place], [place], [address] Phone: [phone]   Fax:  [phone]  Physical Therapy Treatment  Patient Details  Name: Molly Bright MRN: [id number] Date of Birth: 1970-07-03 Referring Provider: Dr. Nadara Mustard  Encounter Date: 09/19/2016      PT End of Session - 09/19/16 1628    Visit Number 7   Number of Visits 13   Date for PT Re-Evaluation 10/23/16   Authorization Type 7/13 visits    PT Start Time 1550   PT Stop Time 1645   PT Time Calculation (min) 55 min   Activity Tolerance Patient tolerated treatment well;No increased pain   Behavior During Therapy WFL for tasks assessed/performed      Past Medical History:  Diagnosis Date  . Allergy   . Anxiety   . Asthma   . Depression     Past Surgical History:  Procedure Laterality Date  . ANKLE SURGERY     tendon repair with incison in L calf   . CESAREAN SECTION     2x  . OOPHORECTOMY Left    removed due to torsion with benign mass    There were no vitals filed for this visit.      Subjective Assessment - 09/19/16 1600    Subjective Pt noticed the side of her foot is not as sore. The soreness in her feet have improved some. Now it comes and goes instead of it being constant. Pt is paying attention to how she is walks and stands on her feet and the soreness decreases. The soreness is worst after sitting.    Pertinent History Hx of LLE injury: Pt injured her L leg from running when she was in the [employer] 20 years ago. She received a cortisone shot and the needle paralyzed the scieatic nerve which caused weakness/ loss of sensation andfeeling/ poor circulation in the L leg. Needles and pins with sitting/standing more > 30 min.  Pt also had a fall 2010 required L ankle repair of tendon, involved incision of L calf. Currently the ankle only will swell with long periods of standing and sitting but no pain.  Hx of plantar fasciitis              [facility] PT Assessment - 09/19/16 1619      Flexibility   Soft Tissue Assessment /Muscle Length --  decreased hip ER/flex abd flexibility B     Palpation   Palpation comment in mobility along mid foot bones B,  L metatarsal , rays B      Ambulation/Gait   Gait velocity 1.3 m/s without cane                      [facility] Adult PT Treatment/Exercise - 09/19/16 1621      Therapeutic Activites    Therapeutic Activities --  see pt instructions                PT Education - 09/19/16 1628    Education provided Yes   Education Details HEP   Person(s) Educated Patient   Methods Explanation;Demonstration;Tactile cues;Verbal cues;Handout   Comprehension Returned demonstration;Verbalized understanding             PT Long Term Goals - 09/19/16 1602      PT LONG TERM GOAL #1   Title Pt will decrease her PFDI score from 45% to   < 40 % in order to improve pelvic floor function (  5/8" 34% )    Time 13   Period Weeks   Status Achieved     PT LONG TERM GOAL #2   Title Pt will decrease her bladder irritants -water ratio from 4:3 to 1:4  in order to promote bladder health   Time 13   Period Weeks   Status Achieved     PT LONG TERM GOAL #3   Title Pt will report improved stool consistency of Type 3 to 4 for 75% of the time in order to promote less straining with bowel movements   Time 13   Period Weeks   Status Achieved     PT LONG TERM GOAL #4   Title Pt will increase her gait speed from 0.77 m/s w/ cane to > 1.2 m/s without cane in order to ambulate safely in the community with decreased risk of falls. (4/16: 1.0 m/s w/ cane, 5/8: 1.1 m/s w/o cane, 1.3 m/s w/ o cane  )    Time 13   Period Weeks   Status Achieved     PT LONG TERM GOAL #5   Title Pt will demo less heel strike and more anterior COM without cuing to improve gait    Time 13   Period Weeks   Status Partially Met     Additional Long Term Goals   Additional Long Term Goals Yes     PT LONG TERM  GOAL #6   Title Pt will demo decreased scar restrictions over her abdomen in order to improve deep core coordination/ mobility to progress to  pelvic floor strengthening and decrease urinary leakage    Time 13   Period Weeks   Status Achieved     PT LONG TERM GOAL #7   Title Pt will report walking up to 30 min without heel nor arch pain in B LE in order to maintain fitness    Time 12   Period Weeks   Status On-going     PT LONG TERM GOAL #8   Title Pt will report decreased feet pain by 50% after sitting at work for > 30 min in order to continue with work tasks.     Time 12   Period Weeks   Status New               Plan - 09/19/16 1629    Clinical Impression Statement Pt continues to progress twoards her remaining goals. Today is the second visit she arrived to the clinic without her cane.  Pt has good carry over with increased midfoot mobility and will getting new shoes and propioceptive insoles soon. Pt was provided a well-rounded lower extremity stretching routine for HEP to apply pre/post-walking routine. Post-Tx, pt showed increased gait speed compared to last session, achieving another one of her goals. (1.1 m/s to 1.3 m/s without cane) . Pt reported feeling not as tight after the stretching routine and she was able to apply the walking techniques. Pt had only soreness in her feet with the 10 ft walking test.  Pt was encouraged to start a 10-15 min at lunch this week and gradually add 10 min another time in the day. Pt continues to benefit from skilled PT.     Rehab Potential Good   PT Frequency 1x / week   PT Duration Other (comment)  13 weeks   PT Treatment/Interventions Manual lymph drainage;Scar mobilization;Neuromuscular re-education;Balance training;Therapeutic exercise;Therapeutic activities;Aquatic Therapy;Moist Heat;Traction;Taping;Energy conservation;Stair training;Gait training;Patient/family education;Functional mobility training;ADLs/Self Care Home Management    Consulted  and Agree with Plan of Care Patient      Patient will benefit from skilled therapeutic intervention in order to improve the following deficits and impairments:  Abnormal gait, Improper body mechanics, Impaired sensation, Increased muscle spasms, Decreased mobility, Decreased endurance, Decreased range of motion, Decreased scar mobility, Decreased coordination, Decreased safety awareness, Decreased activity tolerance, Decreased strength, Increased fascial restricitons, Postural dysfunction, Difficulty walking, Decreased balance  Visit Diagnosis: Muscle weakness (generalized)  Other lack of coordination  Left leg numbness  Plantar fasciitis     Problem List There are no active problems to display for this patient.   Jerl Mina ,PT, DPT, E-RYT  09/19/2016, 4:45 PM  [place] MAIN [facility] SERVICES [address] [place], [place], [address] Phone: [phone]   Fax:  [phone]  Name: Molly Bright MRN: [id number] Date of Birth: [birth year]

## 2016-09-19 NOTE — Patient Instructions (Addendum)
Stretches for your legs:  Use upper arms and elbows for stability when pulling strap   Strap on ballmound: _strap, L knee bent, R ballmound against strap and spread toes, rolling foot 15 deg out and in across midline.  10 reps each side  _knee bends 10 reps     Slide strap under thigh: _R Knee to chest on exhale .  Use upper arms and elbows for stability when pulling strap 10 reps  _slide the L leg out again, slide R foot up and down along the L inside of calf  10 reps     LAYING SIDE Stabilize with L pinky toe and outer foot and leg pressing in to bed _strap at shin, pulling strap with elbow drawn back to bring knee pointing straight parallel to spine and slightly back without arching the low back   10 reps     ______________   Stretches : (Cuing provided for proper alignment)  3 breaths before and after walking   hip flexor stretch on step hip flexor twist on step Seated, foot no a step, knee out, hands back on chair to support, leaning forward after 3 breaths to deepen the stretch

## 2016-10-03 ENCOUNTER — Ambulatory Visit: Payer: No Typology Code available for payment source | Admitting: Physical Therapy

## 2016-10-03 DIAGNOSIS — M722 Plantar fascial fibromatosis: Secondary | ICD-10-CM

## 2016-10-03 DIAGNOSIS — M6281 Muscle weakness (generalized): Secondary | ICD-10-CM | POA: Diagnosis not present

## 2016-10-03 DIAGNOSIS — R2689 Other abnormalities of gait and mobility: Secondary | ICD-10-CM

## 2016-10-03 DIAGNOSIS — R200 Anesthesia of skin: Secondary | ICD-10-CM

## 2016-10-03 DIAGNOSIS — R278 Other lack of coordination: Secondary | ICD-10-CM

## 2016-10-03 NOTE — Therapy (Signed)
[place] MAIN [facility] SERVICES [address] [place], [place], [address] Phone: [phone]   Fax:  [phone]  Physical Therapy Treatment  Patient Details  Name: Molly Bright MRN: [id number] Date of Birth: 03/18/1971 Referring Provider: Dr. Nadara Mustard  Encounter Date: 10/03/2016      PT End of Session - 10/03/16 1614    Visit Number 8   Number of Visits 13   Date for PT Re-Evaluation 10/23/16   Authorization Type 8/13 visits    PT Start Time 1027   PT Stop Time 1700   PT Time Calculation (min) 47 min   Activity Tolerance Patient tolerated treatment well;No increased pain   Behavior During Therapy WFL for tasks assessed/performed      Past Medical History:  Diagnosis Date  . Allergy   . Anxiety   . Asthma   . Depression     Past Surgical History:  Procedure Laterality Date  . ANKLE SURGERY     tendon repair with incison in L calf   . CESAREAN SECTION     2x  . OOPHORECTOMY Left    removed due to torsion with benign mass    There were no vitals filed for this visit.      Subjective Assessment - 10/03/16 1610    Subjective Pt bought the new sneakers recommended by PT. She feels less pain compared to her other tennis shoes.  Pt noticed swelling in her both legs despite drinking water and going to the bathroom. Pt will f/u with her psychiatrist re: fluid retention and medications next Thursday     Pertinent History Hx of LLE injury: Pt injured her L leg from running when she was in the [employer] 20 years ago. She received a cortisone shot and the needle paralyzed the scieatic nerve which caused weakness/ loss of sensation andfeeling/ poor circulation in the L leg. Needles and pins with sitting/standing more > 30 min.  Pt also had a fall 2010 required L ankle repair of tendon, involved incision of L calf. Currently the ankle only will swell with long periods of standing and sitting but no pain.  Hx of plantar fasciitis              [facility] PT Assessment - 10/03/16 2328      Observation/Other Assessments   Observations supination in stance      Palpation   Palpation comment tenderness/tension along 4th ray along dorsal interosseus and extensor digitorium brevis                      [facility] Adult PT Treatment/Exercise - 10/03/16 2333      Therapeutic Activites    Therapeutic Activities --  heel slide (seated), spreading toes, standing with eversion      Neuro Re-ed    Neuro Re-ed Details  increased eversion and abduction in WBing     Manual Therapy   Manual therapy comments STM/fascial mobilization along ext dig brevius, dorsal interoseus between 2,3,4th ray, PA, AP mob of 5th metatarsal     Kinesiotex --  dorsum of foot for eversion, abduction of rays                 PT Education - 10/03/16 2336    Education provided Yes   Education Details HEP   Person(s) Educated Patient   Methods Explanation;Demonstration;Tactile cues;Verbal cues   Comprehension Returned demonstration;Verbalized understanding  PT Long Term Goals - 10/03/16 1615      PT LONG TERM GOAL #1   Title Pt will decrease her PFDI score from 45% to   < 40 % in order to improve pelvic floor function (5/8" 34% )    Time 13   Period Weeks   Status Achieved     PT LONG TERM GOAL #2   Title Pt will decrease her bladder irritants -water ratio from 4:3 to 1:4  in order to promote bladder health   Time 13   Period Weeks   Status Achieved     PT LONG TERM GOAL #3   Title Pt will report improved stool consistency of Type 3 to 4 for 75% of the time in order to promote less straining with bowel movements   Time 13   Period Weeks   Status Achieved     PT LONG TERM GOAL #4   Title Pt will increase her gait speed from 0.77 m/s w/ cane to > 1.2 m/s without cane in order to ambulate safely in the community with decreased risk of falls. (4/16: 1.0 m/s w/ cane, 5/8: 1.1 m/s w/o cane, 1.3 m/s w/ o cane  )    Time 13    Period Weeks   Status Achieved     PT LONG TERM GOAL #5   Title Pt will demo less heel strike and more anterior COM without cuing to improve gait    Time 13   Period Weeks   Status Partially Met     PT LONG TERM GOAL #6   Title Pt will demo decreased scar restrictions over her abdomen in order to improve deep core coordination/ mobility to progress to  pelvic floor strengthening and decrease urinary leakage    Time 13   Period Weeks   Status Achieved     PT LONG TERM GOAL #7   Title Pt will report walking up to 30 min without heel nor arch pain in B LE in order to maintain fitness    Time 12   Period Weeks   Status On-going     PT LONG TERM GOAL #8   Title Pt will report decreased feet pain by 50% after sitting at work for > 30 min in order to continue with work tasks.     Time 12   Period Weeks   Status On-going               Plan - 10/03/16 2336    Clinical Impression Statement Pt continues to show improved gait and did not arrive to sessions with her SPC again today. Pt showed good carry over with midfoot mobility but had restricted mobility of rays 2-5 and increased tensions at dorsal interosseus and extensor digitorium brevis mm. Pt demo'd increased mobility post Tx. Added neuromuscular reeducation for more eversion/abduction and less supination in closed kinetic chain. Pt continues to benefit from skilled PT. Pt will f/u with and appt to see her  her psychiatrist next week regarding the fluid retention she has noticed in both of her LE.     Rehab Potential Good   PT Frequency 1x / week   PT Duration Other (comment)  13 weeks   PT Treatment/Interventions Manual lymph drainage;Scar mobilization;Neuromuscular re-education;Balance training;Therapeutic exercise;Therapeutic activities;Aquatic Therapy;Moist Heat;Traction;Taping;Energy conservation;Stair training;Gait training;Patient/family education;Functional mobility training;ADLs/Self Care Home Management   Consulted  and Agree with Plan of Care Patient      Patient will benefit from skilled therapeutic intervention in  order to improve the following deficits and impairments:  Abnormal gait, Improper body mechanics, Impaired sensation, Increased muscle spasms, Decreased mobility, Decreased endurance, Decreased range of motion, Decreased scar mobility, Decreased coordination, Decreased safety awareness, Decreased activity tolerance, Decreased strength, Increased fascial restricitons, Postural dysfunction, Difficulty walking, Decreased balance  Visit Diagnosis: Muscle weakness (generalized)  Other abnormalities of gait and mobility  Other lack of coordination  Left leg numbness  Plantar fasciitis     Problem List There are no active problems to display for this patient.   Jerl Mina ,PT, DPT, E-RYT  10/03/2016, 11:40 PM  [place] MAIN [facility] SERVICES [address] [place], [place], [address] Phone: [phone]   Fax:  [phone]  Name: Molly Bright MRN: [id number] Date of Birth: Apr 23, 1971

## 2016-10-03 NOTE — Patient Instructions (Signed)
heel slide seated with pinky toes spread (seated), spreading toes, standing with eversion

## 2016-10-10 ENCOUNTER — Ambulatory Visit: Payer: No Typology Code available for payment source | Attending: Internal Medicine | Admitting: Physical Therapy

## 2016-10-10 DIAGNOSIS — M6281 Muscle weakness (generalized): Secondary | ICD-10-CM | POA: Insufficient documentation

## 2016-10-10 DIAGNOSIS — M722 Plantar fascial fibromatosis: Secondary | ICD-10-CM | POA: Diagnosis present

## 2016-10-10 DIAGNOSIS — R2689 Other abnormalities of gait and mobility: Secondary | ICD-10-CM | POA: Insufficient documentation

## 2016-10-10 DIAGNOSIS — R278 Other lack of coordination: Secondary | ICD-10-CM | POA: Insufficient documentation

## 2016-10-10 DIAGNOSIS — R200 Anesthesia of skin: Secondary | ICD-10-CM | POA: Insufficient documentation

## 2016-10-10 NOTE — Patient Instructions (Signed)
After working all day and being on your feet:   Perform belt stretches  High Intensity Interval Training:  Short bouts of cardio   airdyne bike (recumbent)  6 min = 3 min forward cycling, 3 min backward cycling  Complimentary stretches:  Leaning forward: calf stretch/ hip flexor, heel raises seated to loosen feet   Arm cycling machine, backward plus  of resistance     5 min cool down no resistance  Stretches: self-hug, pect wall stretch (hip flexor stance) , forward fold: trunk rotation

## 2016-10-10 NOTE — Therapy (Signed)
[place] [facility] SERVICES [address] [place], [place], [address] Phone: [phone]   Fax:  [phone]  Physical Therapy Treatment  Patient Details  Name: Molly Bright MRN: [id number] Date of Birth: Dec 16, 1970 Referring Provider: Dr. Nadara Mustard  Encounter Date: 10/10/2016      PT End of Session - 10/10/16 1638    Visit Number 9   Number of Visits 13   Date for PT Re-Evaluation 10/23/16   Authorization Type 9/13 visits    PT Start Time 1630   PT Stop Time 1740   PT Time Calculation (min) 70 min   Activity Tolerance Patient tolerated treatment well;No increased pain   Behavior During Therapy WFL for tasks assessed/performed      Past Medical History:  Diagnosis Date  . Allergy   . Anxiety   . Asthma   . Depression     Past Surgical History:  Procedure Laterality Date  . ANKLE SURGERY     tendon repair with incison in L calf   . CESAREAN SECTION     2x  . OOPHORECTOMY Left    removed due to torsion with benign mass    There were no vitals filed for this visit.      Subjective Assessment - 10/10/16 1647    Subjective Pt presented with her cane today as she states she has been on her feet all day and she felt hip and feet pain.     Pertinent History Hx of LLE injury: Pt injured her L leg from running when she was in the [employer] 20 years ago. She received a cortisone shot and the needle paralyzed the scieatic nerve which caused weakness/ loss of sensation andfeeling/ poor circulation in the L leg. Needles and pins with sitting/standing more > 30 min.  Pt also had a fall 2010 required L ankle repair of tendon, involved incision of L calf. Currently the ankle only will swell with long periods of standing and sitting but no pain.  Hx of plantar fasciitis             [facility] PT Assessment - 10/10/16 1648      Observation/Other Assessments   Observations hip pain decreased from 5/10 to 3/10 with her HEP. no manual Tx needed.        Other:   Other/ Comments required education on adjusting seat for cardio machines, and explanation of HIIT principles with complimentary stretches                       [facility] Adult PT Treatment/Exercise - 10/10/16 1816      Therapeutic Activites    Therapeutic Activities --  see pt instructions                PT Education - 10/10/16 1819    Education provided Yes   Education Details principles of HIIT and complimentary stretches   Person(s) Educated Patient   Methods Explanation;Demonstration;Tactile cues;Verbal cues;Handout   Comprehension Returned demonstration;Verbalized understanding             PT Long Term Goals - 10/03/16 1615      PT LONG TERM GOAL #1   Title Pt will decrease her PFDI score from 45% to   < 40 % in order to improve pelvic floor function (5/8" 34% )    Time 13   Period Weeks   Status Achieved     PT LONG TERM GOAL #2   Title  Pt will decrease her bladder irritants -water ratio from 4:3 to 1:4  in order to promote bladder health   Time 13   Period Weeks   Status Achieved     PT LONG TERM GOAL #3   Title Pt will report improved stool consistency of Type 3 to 4 for 75% of the time in order to promote less straining with bowel movements   Time 13   Period Weeks   Status Achieved     PT LONG TERM GOAL #4   Title Pt will increase her gait speed from 0.77 m/s w/ cane to > 1.2 m/s without cane in order to ambulate safely in the community with decreased risk of falls. (4/16: 1.0 m/s w/ cane, 5/8: 1.1 m/s w/o cane, 1.3 m/s w/ o cane  )    Time 13   Period Weeks   Status Achieved     PT LONG TERM GOAL #5   Title Pt will demo less heel strike and more anterior COM without cuing to improve gait    Time 13   Period Weeks   Status Partially Met     PT LONG TERM GOAL #6   Title Pt will demo decreased scar restrictions over her abdomen in order to improve deep core coordination/ mobility to progress to  pelvic floor  strengthening and decrease urinary leakage    Time 13   Period Weeks   Status Achieved     PT LONG TERM GOAL #7   Title Pt will report walking up to 30 min without heel nor arch pain in B LE in order to maintain fitness    Time 12   Period Weeks   Status On-going     PT LONG TERM GOAL #8   Title Pt will report decreased feet pain by 50% after sitting at work for > 30 min in order to continue with work tasks.     Time 12   Period Weeks   Status On-going               Plan - 10/10/16 1649    Clinical Impression Statement Pt was able to decrease her hip pain after being on her feet all day without manual Tx from PT. With belt stretches, pain decreased from 5/10 to 3/10, and with cardio exercises on airdyne bike and arm cycling machine, pt did not feel her her pain. Pt was explained the principles of HIIT and complimetary stretches for her weight loss and fitness goal. Explained to pt the benefits of not loading her joint when using a recumbent airdyne bike which  is more suitable for pt than getting a treadmill which was her initial choice.  Pt voiced understanding. Pt has one more visit before her insurance authorization expires  and plan to continue educating pt on how to exercise for fitness with minimal relapse of her pain while achieving her weight loss goal. Pt wil be consulting a weight loss MD this week. Pt will be ready for d/c aat next visit with skilled PT.    Rehab Potential Good   PT Frequency 1x / week   PT Duration Other (comment)  13 weeks   PT Treatment/Interventions Manual lymph drainage;Scar mobilization;Neuromuscular re-education;Balance training;Therapeutic exercise;Therapeutic activities;Aquatic Therapy;Moist Heat;Traction;Taping;Energy conservation;Stair training;Gait training;Patient/family education;Functional mobility training;ADLs/Self Care Home Management   Consulted and Agree with Plan of Care Patient      Patient will benefit from skilled therapeutic  intervention in order to improve the following deficits and impairments:  Abnormal gait, Improper body mechanics, Impaired sensation, Increased muscle spasms, Decreased mobility, Decreased endurance, Decreased range of motion, Decreased scar mobility, Decreased coordination, Decreased safety awareness, Decreased activity tolerance, Decreased strength, Increased fascial restricitons, Postural dysfunction, Difficulty walking, Decreased balance  Visit Diagnosis: Other abnormalities of gait and mobility  Muscle weakness (generalized)  Other lack of coordination  Left leg numbness  Plantar fasciitis     Problem List There are no active problems to display for this patient.   Jerl Mina ,PT, DPT, E-RYT  10/10/2016, 6:24 PM  [place] [facility] SERVICES [address] [place], [place], [address] Phone: [phone]   Fax:  [phone]  Name: Molly Bright MRN: [id number] Date of Birth: 12-Nov-1970

## 2016-10-17 ENCOUNTER — Ambulatory Visit: Payer: No Typology Code available for payment source | Admitting: Physical Therapy

## 2016-10-17 DIAGNOSIS — R2689 Other abnormalities of gait and mobility: Secondary | ICD-10-CM

## 2016-10-17 DIAGNOSIS — R200 Anesthesia of skin: Secondary | ICD-10-CM

## 2016-10-17 DIAGNOSIS — M722 Plantar fascial fibromatosis: Secondary | ICD-10-CM

## 2016-10-17 DIAGNOSIS — M6281 Muscle weakness (generalized): Secondary | ICD-10-CM

## 2016-10-17 DIAGNOSIS — R278 Other lack of coordination: Secondary | ICD-10-CM

## 2016-10-17 NOTE — Patient Instructions (Signed)
Standing PNF band exercises   "Drawing a sword" " Pulling a lawnmover"  Band is over the edge of the door, knot is on the other side of the door,  R hand holds end of the band by L ear level   Press downward into the ballmounds and heels of the feet, thigh muscle active, Keep knees and hips squared the front and do not move them throughout the activity, Exhale to pull band across the face to the R pocket, rotating only your ribcage    10 x 2 reps    "Pulling seat belt"   Band over door knob with knot on the other side of the door Stand perpendicular to the door,  Exhale to pull band from R ear height across body to the L pocket without turning pelvis and knees  Follow hand with eyes/head  10 x 2 reps

## 2016-10-18 NOTE — Therapy (Signed)
[place] [facility] SERVICES [address] [place], [place], [address] Phone: [phone]   Fax:  [phone]  Physical Therapy Treatment  Patient Details  Name: Molly Bright MRN: [id number] Date of Birth: May 28, 1970 Referring Provider: Dr. Nadara Mustard  Encounter Date: 10/17/2016      PT End of Session - 10/17/16 1614    Visit Number 10   Number of Visits 13   Date for PT Re-Evaluation 10/23/16   Authorization Type 10/13 visits    PT Start Time 1610   PT Stop Time 1705   PT Time Calculation (min) 55 min   Activity Tolerance Patient tolerated treatment well;No increased pain   Behavior During Therapy WFL for tasks assessed/performed      Past Medical History:  Diagnosis Date  . Allergy   . Anxiety   . Asthma   . Depression     Past Surgical History:  Procedure Laterality Date  . ANKLE SURGERY     tendon repair with incison in L calf   . CESAREAN SECTION     2x  . OOPHORECTOMY Left    removed due to torsion with benign mass    There were no vitals filed for this visit.      Subjective Assessment - 10/17/16 1612    Subjective pt reports her feet still hurts after standing on her feet for long periods of time. Pt has been able to use her norditrack bike with resistance up to 6 min.  Pt is stretching out as recommended   Pertinent History Hx of LLE injury: Pt injured her L leg from running when she was in the [employer] 20 years ago. She received a cortisone shot and the needle paralyzed the scieatic nerve which caused weakness/ loss of sensation andfeeling/ poor circulation in the L leg. Needles and pins with sitting/standing more > 30 min.  Pt also had a fall 2010 required L ankle repair of tendon, involved incision of L calf. Currently the ankle only will swell with long periods of standing and sitting but no pain.  Hx of plantar fasciitis             [facility] PT Assessment - 10/18/16 0035      Observation/Other Assessments   Observations excessive cues to not lock knees, education on rationale .      Other:   Other/Comments slower, mindful movements with PNF exercise to promote abdominal strengthening                     [facility] Adult PT Treatment/Exercise - 10/18/16 0034      Therapeutic Activites    Therapeutic Activities --  see pt instructions     Neuro Re-ed    Neuro Re-ed Details  see pt instructions                PT Education - 10/17/16 1625    Education provided Yes   Education Details HEP. d/c   Person(s) Educated Patient   Methods Explanation;Demonstration;Tactile cues;Verbal cues;Handout   Comprehension Returned demonstration;Verbalized understanding             PT Long Term Goals - 10/17/16 1615      PT LONG TERM GOAL #1   Title Pt will decrease her PFDI score from 45% to   < 40 % in order to improve pelvic floor function (5/8" 34% )    Time 13   Period Weeks   Status Achieved  PT LONG TERM GOAL #2   Title Pt will decrease her bladder irritants -water ratio from 4:3 to 1:4  in order to promote bladder health   Time 13   Period Weeks   Status Achieved     PT LONG TERM GOAL #3   Title Pt will report improved stool consistency of Type 3 to 4 for 75% of the time in order to promote less straining with bowel movements   Time 13   Period Weeks   Status Achieved     PT LONG TERM GOAL #4   Title Pt will increase her gait speed from 0.77 m/s w/ cane to > 1.2 m/s without cane in order to ambulate safely in the community with decreased risk of falls. (4/16: 1.0 m/s w/ cane, 5/8: 1.1 m/s w/o cane, 1.3 m/s w/ o cane  )    Time 13   Period Weeks   Status Achieved     PT LONG TERM GOAL #5   Title Pt will demo less heel strike and more anterior COM without cuing to improve gait    Time 13   Period Weeks   Status Achieved     PT LONG TERM GOAL #6   Title Pt will demo decreased scar restrictions over her abdomen in order to improve deep core coordination/  mobility to progress to  pelvic floor strengthening and decrease urinary leakage    Time 13   Period Weeks   Status Achieved     PT LONG TERM GOAL #7   Title Pt will report walking up to 30 min without heel nor arch pain in B LE in order to maintain fitness    Time 12   Period Weeks   Status On-going     PT LONG TERM GOAL #8   Title Pt will report decreased feet pain by 50% after sitting at work for > 30 min in order to continue with work tasks.     Time 12   Period Weeks   Status Not Met               Plan - 10/17/16 1617    Clinical Impression Statement Pt has achieved 6/8 goals. Pt's PFDI score has decreased signficantly as she also reports her mixed urinary incontinence urgency Sx have improved "moderately better" based on the GROC scale. P's bowel movements have also improved with better forming stools based on the [facility] Stool Scale. Pt  demo'd improved deep core coordination and strength as well as increased flexbility and mobility to her spine and hips. Her heel pain has remained the same.  Pt is ready for d/c at this time. Pt has been educated on how to build up a customized fitness routine to minimize relapse of Sx.  Pt was recommended to return to PT again if she would like to continue learning more on safe ways to exercise in the gym setting to address health and prevention during her weight-loss process.  Pt would also benefit from further PT to continue addressing her heel pain through the use of aquatic PT for minimial loading on her joints while building strength.     Rehab Potential Good   PT Frequency 1x / week   PT Duration Other (comment)  13 weeks   PT Treatment/Interventions Manual lymph drainage;Scar mobilization;Neuromuscular re-education;Balance training;Therapeutic exercise;Therapeutic activities;Aquatic Therapy;Moist Heat;Traction;Taping;Energy conservation;Stair training;Gait training;Patient/family education;Functional mobility training;ADLs/Self Care  Home Management   Consulted and Agree with Plan of Care Patient  Patient will benefit from skilled therapeutic intervention in order to improve the following deficits and impairments:  Abnormal gait, Improper body mechanics, Impaired sensation, Increased muscle spasms, Decreased mobility, Decreased endurance, Decreased range of motion, Decreased scar mobility, Decreased coordination, Decreased safety awareness, Decreased activity tolerance, Decreased strength, Increased fascial restricitons, Postural dysfunction, Difficulty walking, Decreased balance  Visit Diagnosis: Other abnormalities of gait and mobility  Muscle weakness (generalized)  Other lack of coordination  Left leg numbness  Plantar fasciitis       G-Codes - 12-Nov-2016 1616    Functional Assessment Tool Used (Outpatient Only) PFDI 34%    Functional Limitation Self care   Self Care Current Status (M3559) At least 20 percent but less than 40 percent impaired, limited or restricted   Self Care Goal Status (R4163) At least 20 percent but less than 40 percent impaired, limited or restricted   Self Care Discharge Status [phone]) At least 20 percent but less than 40 percent impaired, limited or restricted      Problem List There are no active problems to display for this patient.   Jerl Mina ,PT, DPT, E-RYT  10/18/2016, 12:37 AM  [place] MAIN [facility] SERVICES [address] [place], [place], [address] Phone: [phone]   Fax:  [phone]  Name: Molly Bright MRN: [id number] Date of Birth: October 28, 1970

## 2016-10-24 ENCOUNTER — Encounter: Payer: No Typology Code available for payment source | Admitting: Physical Therapy

## 2016-10-26 ENCOUNTER — Ambulatory Visit: Payer: Self-pay

## 2016-10-26 ENCOUNTER — Ambulatory Visit (INDEPENDENT_AMBULATORY_CARE_PROVIDER_SITE_OTHER): Payer: 59

## 2016-10-26 DIAGNOSIS — Z308 Encounter for other contraceptive management: Secondary | ICD-10-CM | POA: Diagnosis not present

## 2016-10-26 LAB — POCT URINE PREGNANCY: Preg Test, Ur: NEGATIVE

## 2016-10-26 MED ORDER — MEDROXYPROGESTERONE ACETATE 150 MG/ML IM SUSP
150.0000 mg | Freq: Once | INTRAMUSCULAR | Status: AC
  Administered 2016-10-26: 150 mg via INTRAMUSCULAR

## 2016-10-26 NOTE — Progress Notes (Signed)
Pt here for depo inj which was given IM left deltoid after negative urine preg test.  NDC # [id number]

## 2016-10-31 ENCOUNTER — Encounter: Payer: No Typology Code available for payment source | Admitting: Physical Therapy

## 2016-11-23 ENCOUNTER — Ambulatory Visit: Payer: Self-pay | Admitting: Obstetrics and Gynecology

## 2016-12-11 ENCOUNTER — Ambulatory Visit (INDEPENDENT_AMBULATORY_CARE_PROVIDER_SITE_OTHER): Payer: 59 | Admitting: Obstetrics and Gynecology

## 2016-12-11 ENCOUNTER — Encounter: Payer: Self-pay | Admitting: Obstetrics and Gynecology

## 2016-12-11 VITALS — BP 112/74 | HR 111 | Ht 64.0 in | Wt 274.0 lb

## 2016-12-11 DIAGNOSIS — Z1231 Encounter for screening mammogram for malignant neoplasm of breast: Secondary | ICD-10-CM | POA: Diagnosis not present

## 2016-12-11 DIAGNOSIS — Z1211 Encounter for screening for malignant neoplasm of colon: Secondary | ICD-10-CM | POA: Diagnosis not present

## 2016-12-11 DIAGNOSIS — Z124 Encounter for screening for malignant neoplasm of cervix: Secondary | ICD-10-CM

## 2016-12-11 DIAGNOSIS — Z01419 Encounter for gynecological examination (general) (routine) without abnormal findings: Secondary | ICD-10-CM | POA: Diagnosis not present

## 2016-12-11 DIAGNOSIS — Z1239 Encounter for other screening for malignant neoplasm of breast: Secondary | ICD-10-CM

## 2016-12-11 MED ORDER — MEDROXYPROGESTERONE ACETATE 150 MG/ML IM SUSY: 150.0000 mg | PREFILLED_SYRINGE | INTRAMUSCULAR | 3 refills | Status: DC

## 2016-12-11 NOTE — Progress Notes (Signed)
Patient ID: Molly Bright, female   DOB: May 11, 1970, 46 y.o.   MRN: [id number]     Gynecology Annual Exam  PCP: Patient, No Pcp Per  Chief Complaint:  Chief Complaint  Patient presents with  . Gynecologic Exam    History of Present Illness: Patient is a 46 y.o. G2P2 presents for annual exam. The patient has no complaints today.   LMP: No LMP recorded. Patient has had an injection.  The patient is sexually active. She currently uses Depo-Provera injections for contraception. She denies dyspareunia.  The patient does perform self breast exams.  There is no notable family history of breast or ovarian cancer in her family.  The patient wears seatbelts: yes.   The patient has regular exercise: not asked.    The patient denies current symptoms of depression.    Review of Systems: ROS  Past Medical History:  Past Medical History:  Diagnosis Date  . Allergy   . Anxiety   . Asthma   . Depression     Past Surgical History:  Past Surgical History:  Procedure Laterality Date  . ANKLE SURGERY     tendon repair with incison in L calf   . CESAREAN SECTION     2x  . OOPHORECTOMY Left 08/2015   removed due to torsion with benign mass    Gynecologic History:  No LMP recorded. Patient has had an injection. Contraception: Depo-Provera injections Last Pap: Results were: 06/15/15 NIL and HR HPV negative  Last mammogram: none on record  Obstetric History: G2P2  Family History:  Family History  Problem Relation Age of Onset  . Cancer Maternal Aunt   . Cancer Paternal Grandmother   . Stroke Paternal Grandmother   . Heart disease Paternal Grandfather     Social History:  Social History   Social History  . Marital status: Divorced    Spouse name: N/A  . Number of children: N/A  . Years of education: N/A   Occupational History  . Not on file.   Social History Main Topics  . Smoking status: Never Smoker  . Smokeless tobacco: Never Used  . Alcohol use No  . Drug use: No   . Sexual activity: Not Currently    Partners: Male    Birth control/ protection: Injection   Other Topics Concern  . Not on file   Social History Narrative  . No narrative on file    Allergies:  No Known Allergies  Medications: Prior to Admission medications   Medication Sig Start Date End Date Taking? Authorizing Provider  Ascorbic Acid (VITAMIN C) 1000 MG tablet Take 1,000 mg by mouth daily.   Yes [provider]  b complex vitamins capsule Take 1 capsule by mouth daily.   Yes [provider]  cholecalciferol (VITAMIN D) 1000 units tablet Take 1,000 Units by mouth daily.   Yes [provider]  citalopram (CELEXA) 10 MG/5ML suspension Take by mouth daily.   Yes [provider]  LamoTRIgine (LAMICTAL PO) Take 60 mg by mouth.   Yes [provider]  MedroxyPROGESTERone Acetate 150 MG/ML SUSY  09/26/16  Yes [provider]  PROAIR HFA 108 (90 Base) MCG/ACT inhaler  09/09/16  Yes [provider]  traZODone (DESYREL) 150 MG tablet Take by mouth at bedtime.   Yes [provider]    Physical Exam Vitals: Blood pressure 112/74, pulse (!) 111, height 5\' 4"  (1.626 m), weight 274 lb (124.3 kg).  General: NAD HEENT: normocephalic, anicteric Thyroid:  no enlargement, no palpable nodules Pulmonary: No increased work of breathing, CTAB Cardiovascular: RRR, distal pulses 2+ Breast: Breast symmetrical, no tenderness, no palpable nodules or masses, no skin or nipple retraction present, no nipple discharge.  No axillary or supraclavicular lymphadenopathy. Abdomen: NABS, soft, non-tender, non-distended.  Umbilicus without lesions.  No hepatomegaly, splenomegaly or masses palpable. No evidence of hernia  Genitourinary:  External: Normal external female genitalia.  Normal urethral meatus, normal  Bartholin's and Skene's glands.    Vagina: Normal vaginal mucosa, no evidence of prolapse.    Cervix: Grossly normal in appearance, no  bleeding  Uterus: Non-enlarged, mobile, normal contour.  No CMT  Adnexa: ovaries non-enlarged, no adnexal masses  Rectal: deferred  Lymphatic: no evidence of inguinal lymphadenopathy Extremities: no edema, erythema, or tenderness Neurologic: Grossly intact Psychiatric: mood appropriate, affect full  Female chaperone present for pelvic and breast  portions of the physical exam    Assessment: 46 y.o. G2P2 routine annual exam  Plan: Problem List Items Addressed This Visit    None    Visit Diagnoses    Screening for malignant neoplasm of cervix       Relevant Orders   PapIG, HPV, rfx 16/18   Breast screening       Relevant Orders   MM DIGITAL SCREENING BILATERAL   Encounter for gynecological examination without abnormal finding       Relevant Orders   PapIG, HPV, rfx 16/18      1) Mammogram - recommend yearly screening mammogram.  Mammogram Was ordered today   2) STI screening was offered and declined  3) ASCCP guidelines and rational discussed.  Patient opts for yearly screening interval  4) Contraception - Patient opts for depo-provera for contraception.    She understands that Depo-Provera is a progesterone only therapy, and that patients often patients have irregular and unpredictable vaginal bleeding or amenorrhea. She understands that other side effects are possible related to systemic progesterone, including but not limited to, headaches, breast tenderness, nausea, and irritability. While effective at preventing pregnancy Depo-Provera does not prevent transmission of sexually transmitted diseases and use of barrier methods for this purpose was discussed.  She is aware of the need to return every 3 months for re-dosing.  We also discussed the FDA black box warning regarding long term use (greater than 2 years) and bone loss.  5) Colonoscopy -- Screening recommended starting at age 47 for average risk individuals, age 47 for individuals deemed at increased risk (including  African Americans) and recommended to continue until age 47.  For patient age 47-85 individualized approach is recommended.  Gold standard screening is via colonoscopy, Cologuard screening is an acceptable alternative for patient unwilling or unable to undergo colonoscopy.  "Colorectal cancer screening for average?risk adults: 2018 guideline update from the American Cancer Society"[place]: A Cancer Journal for Clinicians: Oct 04, 2016  - ordered  6) Routine healthcare maintenance including cholesterol, diabetes screening discussed managed by PCP  [facility]

## 2016-12-11 NOTE — Patient Instructions (Signed)
Preventive Care 40-64 Years, Female Preventive care refers to lifestyle choices and visits with your health care provider that can promote health and wellness. What does preventive care include?  A yearly physical exam. This is also called an annual well check.  Dental exams once or twice a year.  Routine eye exams. Ask your health care provider how often you should have your eyes checked.  Personal lifestyle choices, including: ? Daily care of your teeth and gums. ? Regular physical activity. ? Eating a healthy diet. ? Avoiding tobacco and drug use. ? Limiting alcohol use. ? Practicing safe sex. ? Taking low-dose aspirin daily starting at age 58. ? Taking vitamin and mineral supplements as recommended by your health care provider. What happens during an annual well check? The services and screenings done by your health care provider during your annual well check will depend on your age, overall health, lifestyle risk factors, and family history of disease. Counseling Your health care provider may ask you questions about your:  Alcohol use.  Tobacco use.  Drug use.  Emotional well-being.  Home and relationship well-being.  Sexual activity.  Eating habits.  Work and work Statistician.  Method of birth control.  Menstrual cycle.  Pregnancy history.  Screening You may have the following tests or measurements:  Height, weight, and BMI.  Blood pressure.  Lipid and cholesterol levels. These may be checked every 5 years, or more frequently if you are over 81 years old.  Skin check.  Lung cancer screening. You may have this screening every year starting at age 78 if you have a 30-pack-year history of smoking and currently smoke or have quit within the past 15 years.  Fecal occult blood test (FOBT) of the stool. You may have this test every year starting at age 65.  Flexible sigmoidoscopy or colonoscopy. You may have a sigmoidoscopy every 5 years or a colonoscopy  every 10 years starting at age 30.  Hepatitis C blood test.  Hepatitis B blood test.  Sexually transmitted disease (STD) testing.  Diabetes screening. This is done by checking your blood sugar (glucose) after you have not eaten for a while (fasting). You may have this done every 1-3 years.  Mammogram. This may be done every 1-2 years. Talk to your health care provider about when you should start having regular mammograms. This may depend on whether you have a family history of breast cancer.  BRCA-related cancer screening. This may be done if you have a family history of breast, ovarian, tubal, or peritoneal cancers.  Pelvic exam and Pap test. This may be done every 3 years starting at age 80. Starting at age 36, this may be done every 5 years if you have a Pap test in combination with an HPV test.  Bone density scan. This is done to screen for osteoporosis. You may have this scan if you are at high risk for osteoporosis.  Discuss your test results, treatment options, and if necessary, the need for more tests with your health care provider. Vaccines Your health care provider may recommend certain vaccines, such as:  Influenza vaccine. This is recommended every year.  Tetanus, diphtheria, and acellular pertussis (Tdap, Td) vaccine. You may need a Td booster every 10 years.  Varicella vaccine. You may need this if you have not been vaccinated.  Zoster vaccine. You may need this after age 5.  Measles, mumps, and rubella (MMR) vaccine. You may need at least one dose of MMR if you were born in  1958 or later. You may also need a second dose.  Pneumococcal 13-valent conjugate (PCV13) vaccine. You may need this if you have certain conditions and were not previously vaccinated.  Pneumococcal polysaccharide (PPSV23) vaccine. You may need one or two doses if you smoke cigarettes or if you have certain conditions.  Meningococcal vaccine. You may need this if you have certain  conditions.  Hepatitis A vaccine. You may need this if you have certain conditions or if you travel or work in places where you may be exposed to hepatitis A.  Hepatitis B vaccine. You may need this if you have certain conditions or if you travel or work in places where you may be exposed to hepatitis B.  Haemophilus influenzae type b (Hib) vaccine. You may need this if you have certain conditions.  Talk to your health care provider about which screenings and vaccines you need and how often you need them. This information is not intended to replace advice given to you by your health care provider. Make sure you discuss any questions you have with your health care provider. Document Released: 05/21/2015 Document Revised: 01/12/2016 Document Reviewed: 02/23/2015 Elsevier Interactive Patient Education  2017 [employer].

## 2016-12-14 LAB — PAPIG, HPV, RFX 16/18
HPV, high-risk: NEGATIVE
PAP Smear Comment: 0

## 2017-01-18 ENCOUNTER — Ambulatory Visit: Payer: 59

## 2017-01-18 ENCOUNTER — Ambulatory Visit (INDEPENDENT_AMBULATORY_CARE_PROVIDER_SITE_OTHER): Payer: 59

## 2017-01-18 DIAGNOSIS — Z3042 Encounter for surveillance of injectable contraceptive: Secondary | ICD-10-CM

## 2017-01-18 MED ORDER — MEDROXYPROGESTERONE ACETATE 150 MG/ML IM SUSP
150.0000 mg | Freq: Once | INTRAMUSCULAR | Status: AC
  Administered 2017-01-18: 150 mg via INTRAMUSCULAR

## 2017-01-19 ENCOUNTER — Ambulatory Visit: Payer: 59

## 2017-04-12 ENCOUNTER — Ambulatory Visit (INDEPENDENT_AMBULATORY_CARE_PROVIDER_SITE_OTHER): Payer: 59

## 2017-04-12 DIAGNOSIS — Z3042 Encounter for surveillance of injectable contraceptive: Secondary | ICD-10-CM

## 2017-04-12 DIAGNOSIS — Z308 Encounter for other contraceptive management: Secondary | ICD-10-CM

## 2017-04-12 MED ORDER — MEDROXYPROGESTERONE ACETATE 150 MG/ML IM SUSP
150.0000 mg | Freq: Once | INTRAMUSCULAR | Status: AC
  Administered 2017-04-12: 150 mg via INTRAMUSCULAR

## 2017-04-12 NOTE — Progress Notes (Signed)
Pt here for depo which was given IM left deltoid. NDC# [address]-2 

## 2017-07-05 ENCOUNTER — Ambulatory Visit (INDEPENDENT_AMBULATORY_CARE_PROVIDER_SITE_OTHER): Payer: 59

## 2017-07-05 DIAGNOSIS — Z3042 Encounter for surveillance of injectable contraceptive: Secondary | ICD-10-CM

## 2017-07-05 MED ORDER — MEDROXYPROGESTERONE ACETATE 150 MG/ML IM SUSP
150.0000 mg | Freq: Once | INTRAMUSCULAR | Status: AC
  Administered 2017-07-05: 150 mg via INTRAMUSCULAR

## 2017-09-27 ENCOUNTER — Ambulatory Visit (INDEPENDENT_AMBULATORY_CARE_PROVIDER_SITE_OTHER): Payer: 59

## 2017-09-27 DIAGNOSIS — Z3042 Encounter for surveillance of injectable contraceptive: Secondary | ICD-10-CM | POA: Diagnosis not present

## 2017-09-27 MED ORDER — MEDROXYPROGESTERONE ACETATE 150 MG/ML IM SUSP
150.0000 mg | Freq: Once | INTRAMUSCULAR | Status: AC
  Administered 2017-09-27: 150 mg via INTRAMUSCULAR

## 2017-09-27 NOTE — Progress Notes (Signed)
edroxy

## 2017-09-27 NOTE — Telephone Encounter (Signed)
This encounter was created in error - please disregard.

## 2017-11-27 ENCOUNTER — Other Ambulatory Visit: Payer: Self-pay

## 2017-11-27 MED ORDER — MEDROXYPROGESTERONE ACETATE 150 MG/ML IM SUSY: 150.0000 mg | PREFILLED_SYRINGE | INTRAMUSCULAR | 3 refills | Status: DC

## 2017-12-20 ENCOUNTER — Ambulatory Visit (INDEPENDENT_AMBULATORY_CARE_PROVIDER_SITE_OTHER): Payer: 59

## 2017-12-20 DIAGNOSIS — Z3042 Encounter for surveillance of injectable contraceptive: Secondary | ICD-10-CM | POA: Diagnosis not present

## 2017-12-20 MED ORDER — MEDROXYPROGESTERONE ACETATE 150 MG/ML IM SUSP
150.0000 mg | Freq: Once | INTRAMUSCULAR | Status: AC
  Administered 2017-12-20: 150 mg via INTRAMUSCULAR

## 2018-01-03 ENCOUNTER — Telehealth: Payer: Self-pay | Admitting: Obstetrics and Gynecology

## 2018-01-03 ENCOUNTER — Other Ambulatory Visit (HOSPITAL_COMMUNITY)
Admission: RE | Admit: 2018-01-03 | Discharge: 2018-01-03 | Disposition: A | Discharge status: Home / Self Care | Payer: 59 | Source: Ambulatory Visit | Attending: Obstetrics and Gynecology | Admitting: Obstetrics and Gynecology

## 2018-01-03 ENCOUNTER — Ambulatory Visit (INDEPENDENT_AMBULATORY_CARE_PROVIDER_SITE_OTHER): Payer: 59 | Admitting: Obstetrics and Gynecology

## 2018-01-03 ENCOUNTER — Encounter: Payer: Self-pay | Admitting: Obstetrics and Gynecology

## 2018-01-03 VITALS — BP 124/70 | HR 115 | Wt 276.0 lb

## 2018-01-03 DIAGNOSIS — Z01419 Encounter for gynecological examination (general) (routine) without abnormal findings: Secondary | ICD-10-CM

## 2018-01-03 DIAGNOSIS — Z1151 Encounter for screening for human papillomavirus (HPV): Secondary | ICD-10-CM | POA: Diagnosis not present

## 2018-01-03 DIAGNOSIS — Z1231 Encounter for screening mammogram for malignant neoplasm of breast: Secondary | ICD-10-CM

## 2018-01-03 DIAGNOSIS — Z124 Encounter for screening for malignant neoplasm of cervix: Secondary | ICD-10-CM

## 2018-01-03 DIAGNOSIS — Z1239 Encounter for other screening for malignant neoplasm of breast: Secondary | ICD-10-CM

## 2018-01-03 MED ORDER — MEDROXYPROGESTERONE ACETATE 150 MG/ML IM SUSY: 150.0000 mg | PREFILLED_SYRINGE | INTRAMUSCULAR | 3 refills | Status: DC

## 2018-01-03 NOTE — Progress Notes (Signed)
Gynecology Annual Exam  PCP: Patient, No Pcp Per  Chief Complaint:  Chief Complaint  Patient presents with  . Gynecologic Exam    History of Present Illness: Patient is a 47 y.o. G2P2 presents for annual exam. The patient has no complaints today.   LMP: No LMP recorded. Patient has had an injection. Amenorrhea on depo provera.   The patient is not currently sexually active. She currently uses Depo-Provera injections for contraception and cycle control.   The patient does perform self breast exams.  There is no notable family history of breast or ovarian cancer in her family.  The patient wears seatbelts: yes.   The patient has regular exercise: not asked.    The patient denies current symptoms of depression.    Review of Systems: ROS  Past Medical History:  Past Medical History:  Diagnosis Date  . Allergy   . Anxiety   . Asthma   . Depression   . Hashimoto's disease     Past Surgical History:  Past Surgical History:  Procedure Laterality Date  . ANKLE SURGERY     tendon repair with incison in L calf   . CESAREAN SECTION     2x  . OOPHORECTOMY Left 08/2015   removed due to torsion with benign mass    Gynecologic History:  No LMP recorded. Patient has had an injection. Contraception: Depo-Provera injections Last Pap: Results were: 12/11/16 NIL and HR HPV negative  Last mammogram: 12/30/2010  Results were: BI-RAD I  Obstetric History: G2P2  Family History:  Family History  Problem Relation Age of Onset  . Cancer Maternal Aunt   . Cancer Paternal Grandmother   . Stroke Paternal Grandmother   . Heart disease Paternal Grandfather     Social History:  Social History   Socioeconomic History  . Marital status: Divorced    Spouse name: Not on file  . Number of children: Not on file  . Years of education: Not on file  . Highest education level: Not on file  Occupational History  . Not on file  Social Needs  . Financial resource strain: Not on file  .  Food insecurity:    Worry: Not on file    Inability: Not on file  . Transportation needs:    Medical: Not on file    Non-medical: Not on file  Tobacco Use  . Smoking status: Never Smoker  . Smokeless tobacco: Never Used  Substance and Sexual Activity  . Alcohol use: No  . Drug use: No  . Sexual activity: Not Currently    Partners: Male    Birth control/protection: Injection  Lifestyle  . Physical activity:    Days per week: Not on file    Minutes per session: Not on file  . Stress: Not on file  Relationships  . Social connections:    Talks on phone: Not on file    Gets together: Not on file    Attends religious service: Not on file    Active member of club or organization: Not on file    Attends meetings of clubs or organizations: Not on file    Relationship status: Not on file  . Intimate partner violence:    Fear of current or ex partner: Not on file    Emotionally abused: Not on file    Physically abused: Not on file    Forced sexual activity: Not on file  Other Topics Concern  . Not on file  Social History  Narrative  . Not on file    Allergies:  No Known Allergies  Medications: Prior to Admission medications   Medication Sig Start Date End Date Taking? Authorizing Provider  Ascorbic Acid (VITAMIN C) 1000 MG tablet Take 1,000 mg by mouth daily.    [provider]  b complex vitamins capsule Take 1 capsule by mouth daily.    [provider]  cholecalciferol (VITAMIN D) 1000 units tablet Take 1,000 Units by mouth daily.    [provider]  citalopram (CELEXA) 10 MG/5ML suspension Take by mouth daily.    [provider]  LamoTRIgine (LAMICTAL PO) Take 60 mg by mouth.    [provider]  medroxyPROGESTERone Acetate 150 MG/ML SUSY Inject 1 mL (150 mg total) into the muscle every 3 (three) months. 11/27/17   Vena AustriaStaebler, Yoel Kaufhold, MD  PROAIR HFA 108 [phone](90 Base) MCG/ACT inhaler  09/09/16   [provider]  traZODone  (DESYREL) 150 MG tablet Take by mouth at bedtime.    [provider]    Physical Exam Vitals: There were no vitals taken for this visit.  General: NAD HEENT: normocephalic, anicteric Thyroid: no enlargement, no palpable nodules Pulmonary: No increased work of breathing, CTAB Cardiovascular: RRR, distal pulses 2+ Breast: Breast symmetrical, no tenderness, no palpable nodules or masses, no skin or nipple retraction present, no nipple discharge.  No axillary or supraclavicular lymphadenopathy. Abdomen: NABS, soft, non-tender, non-distended.  Umbilicus without lesions.  No hepatomegaly, splenomegaly or masses palpable. No evidence of hernia  Genitourinary:  External: Normal external female genitalia.  Normal urethral meatus, normal Bartholin's and Skene's glands.    Vagina: Normal vaginal mucosa, no evidence of prolapse.    Cervix: Grossly normal in appearance, no bleeding  Uterus: Non-enlarged, mobile, normal contour.  No CMT  Adnexa: ovaries non-enlarged, no adnexal masses  Rectal: deferred  Lymphatic: no evidence of inguinal lymphadenopathy Extremities: no edema, erythema, or tenderness Neurologic: Grossly intact Psychiatric: mood appropriate, affect full  Female chaperone present for pelvic and breast  portions of the physical exam    Assessment: 47 y.o. G2P2 routine annual exam  Plan: Problem List Items Addressed This Visit    None    Visit Diagnoses    Encounter for gynecological examination without abnormal finding    -  Primary   Screening for malignant neoplasm of cervix       Relevant Orders   Cytology - PAP   Breast screening       Relevant Orders   MM 3D SCREEN BREAST BILATERAL      1) Mammogram - recommend yearly screening mammogram.  Mamm[facility] Was ordered today - [facility]  2) STI screening  was notoffered and therefore not obtained  3) ASCCP guidelines and rational discussed.  Patient opts for yearly screening interval  4) Contraception - the  patient is currently using  Depo-Provera injections.  She is happy with her current form of contraception and plans to continue  - [facility].  Currently doing  medical weightloss management at a bariatric clinic prior to hernia surger  5) Colonoscopy - due at age 47  6) Routine healthcare maintenance including cholesterol, diabetes screening discussed managed by PCP  7) Return in about 1 year (around 01/04/2019) for annual (continue every 3 months nurse visit for depo injection).   Vena AustriaAndreas Filimon Miranda, MD, Merlinda FrederickFACOG [facility], [facility] 01/03/2018, 2:33 PM

## 2018-01-03 NOTE — Telephone Encounter (Signed)
Patient scheduled 9/13 for Mirena with AMS

## 2018-01-03 NOTE — Telephone Encounter (Signed)
Noted. Will order to arrive by apt date/time. 

## 2018-01-03 NOTE — Patient Instructions (Signed)
[facility] [address] [place] [address]  [facility]  [address] [place] [address]  Phone: [phone]  

## 2018-01-04 ENCOUNTER — Ambulatory Visit: Payer: 59 | Admitting: Obstetrics and Gynecology

## 2018-01-09 LAB — CYTOLOGY - PAP
ADEQUACY: ABSENT
DIAGNOSIS: NEGATIVE
HPV (WINDOPATH): NOT DETECTED

## 2018-01-14 NOTE — Telephone Encounter (Signed)
R/S to 9/16 at 430

## 2018-01-18 ENCOUNTER — Ambulatory Visit: Payer: 59 | Admitting: Obstetrics and Gynecology

## 2018-01-21 ENCOUNTER — Ambulatory Visit (INDEPENDENT_AMBULATORY_CARE_PROVIDER_SITE_OTHER): Payer: 59 | Admitting: Obstetrics and Gynecology

## 2018-01-21 ENCOUNTER — Encounter: Payer: Self-pay | Admitting: Obstetrics and Gynecology

## 2018-01-21 VITALS — BP 120/80 | HR 104 | Ht 64.0 in | Wt 275.0 lb

## 2018-01-21 DIAGNOSIS — Z30430 Encounter for insertion of intrauterine contraceptive device: Secondary | ICD-10-CM | POA: Diagnosis not present

## 2018-01-21 DIAGNOSIS — Z538 Procedure and treatment not carried out for other reasons: Secondary | ICD-10-CM

## 2018-01-21 MED ORDER — NORETHINDRONE 0.35 MG PO TABS: 1.0000 | ORAL_TABLET | Freq: Every day | ORAL | 11 refills | Status: DC

## 2018-01-21 NOTE — Progress Notes (Signed)
   GYNECOLOGY OFFICE PROCEDURE NOTE  Molly Bright is a 47 y.o. G2P2 here for a Mirena IUD insertion. No GYN concerns.  Last pap smear was on 01/03/2018 and was normal.  The patient is currently using Depo Provera for contraception and her LMP is No LMP recorded. Patient has had an injection..  The indication for her IUD is cycle control.  IUD Insertion Procedure Note Patient identified, informed consent performed, consent signed.   Discussed risks of irregular bleeding, cramping, infection, malpositioning, expulsion or uterine perforation of the IUD (1:1000 placements)  which may require further procedure such as laparoscopy.  IUD while effective at preventing pregnancy do not prevent transmission of sexually transmitted diseases and use of barrier methods for this purpose was discussed. Time out was performed.  Urine pregnancy test negative.  Speculum placed in the vagina.  Cervix visualized.  Very anterior, nulliparous.  I suspect this may be secondary to some ahesive disease of the uterus to the anterior abdominal wall from the patient's prior cesarean sections or patient's history of uterine fibroids.  Cleaned with Betadine x 2.  Grasped anteriorly with a single tooth tenaculum.  Uterus sounded to 10 cm using an endometrial pipelle. IUD was unable to be passes secondary to increased resistance  at which time rather than risking misplacement the procedure was aborted. Will trial patient on norethindrone for cycle control to decrease overall progestin exposure and aid in weight loss but hopefully still aid in cycle control  Vena AustriaAndreas Tranice Laduke, MD, Merlinda FrederickFACOG [facility], [facility]

## 2018-04-21 ENCOUNTER — Other Ambulatory Visit: Payer: Self-pay

## 2018-04-21 ENCOUNTER — Encounter: Payer: Self-pay | Admitting: Gynecology

## 2018-04-21 ENCOUNTER — Ambulatory Visit
Admission: EM | Admit: 2018-04-21 | Discharge: 2018-04-21 | Disposition: A | Discharge status: Home / Self Care | Payer: 59 | Attending: Emergency Medicine | Admitting: Emergency Medicine

## 2018-04-21 DIAGNOSIS — R05 Cough: Secondary | ICD-10-CM

## 2018-04-21 DIAGNOSIS — R0989 Other specified symptoms and signs involving the circulatory and respiratory systems: Secondary | ICD-10-CM | POA: Diagnosis not present

## 2018-04-21 DIAGNOSIS — R0982 Postnasal drip: Secondary | ICD-10-CM | POA: Diagnosis not present

## 2018-04-21 DIAGNOSIS — R0981 Nasal congestion: Secondary | ICD-10-CM

## 2018-04-21 DIAGNOSIS — J019 Acute sinusitis, unspecified: Secondary | ICD-10-CM

## 2018-04-21 DIAGNOSIS — J069 Acute upper respiratory infection, unspecified: Secondary | ICD-10-CM | POA: Insufficient documentation

## 2018-04-21 MED ORDER — BENZONATATE 200 MG PO CAPS: ORAL_CAPSULE | ORAL | 0 refills | Status: DC

## 2018-04-21 MED ORDER — FLUTICASONE PROPIONATE 50 MCG/ACT NA SUSP: 2.0000 | Freq: Every day | NASAL | 0 refills | Status: AC

## 2018-04-21 MED ORDER — HYDROCOD POLST-CPM POLST ER 10-8 MG/5ML PO SUER: 5.0000 mL | Freq: Two times a day (BID) | ORAL | 0 refills | Status: DC

## 2018-04-21 NOTE — ED Provider Notes (Signed)
[facility]    CSN: [id number] Arrival date & time: 04/21/18  1132     History   Chief Complaint No chief complaint on file.   HPI Molly Bright is a 47 y.o. female.   HPI  33-year-old female states that Thursday prior to this visit [place]  On her way back she started having cough and nasal congestion.  Today she feels like she has a elephant sitting on her chest from the congestion.  Having yellow-green snot charge.  No fever or chills.  O2 sats are 99%.  He has a history of asthma treated at the [place] not endorse any using or significant shortness of breath.        Past Medical History:  Diagnosis Date  . Allergy   . Anxiety   . Asthma   . Depression   . Hashimoto's disease     There are no active problems to display for this patient.   Past Surgical History:  Procedure Laterality Date  . ANKLE SURGERY     tendon repair with incison in L calf   . CESAREAN SECTION     2x  . OOPHORECTOMY Left 08/2015   removed due to torsion with benign mass    OB History    Gravida  2   Para  2   Term      Preterm      AB      Living  2     SAB      TAB      Ectopic      Multiple      Live Births               Home Medications    Prior to Admission medications   Medication Sig Start Date End Date Taking? Authorizing Provider  Ascorbic Acid (VITAMIN C) 1000 MG tablet Take 1,000 mg by mouth daily.   Yes [provider]  b complex vitamins capsule Take 1 capsule by mouth daily.   Yes [provider]  cholecalciferol (VITAMIN D) 1000 units tablet Take 1,000 Units by mouth daily.   Yes [provider]  citalopram (CELEXA) 10 MG/5ML suspension Take by mouth daily.   Yes [provider]  DULoxetine (CYMBALTA) 60 MG capsule Take by mouth.   Yes [provider]  LamoTRIgine (LAMICTAL PO) Take 60 mg by mouth.   Yes [provider]  Levothyroxine Sodium  (TIROSINT) 200 MCG CAPS Take by mouth. 12/07/17  Yes [provider]  norethindrone (MICRONOR,CAMILA,ERRIN) 0.35 MG tablet Take 1 tablet (0.35 mg total) by mouth daily. 01/21/18  Yes [name], MD  phentermine (ADIPEX-P) 37.5 MG tablet Take by mouth.   Yes [provider]  PROAIR HFA 108 [phone] Base) MCG/ACT inhaler  09/09/16  Yes [provider]  Semaglutide 0.25 or 0.5 MG/DOSE SOPN Inject into the skin.   Yes [provider]  traZODone (DESYREL) 150 MG tablet Take by mouth at bedtime.   Yes [provider]  TROKENDI XR 50 MG CP24 TK ONE C PO D 12/15/17  Yes [provider]  benzonatate (TESSALON) 200 MG capsule Take one cap TID PRN cough 04/21/18   Lutricia Feil, PA-C  chlorpheniramine-HYDROcodone (TUSSIONEX PENNKINETIC ER) 10-8 MG/5ML SUER Take 5 mLs by mouth 2 (two) times daily. 04/21/18   Lutricia Feil, PA-C  fluticasone (FLONASE) 50 MCG/ACT nasal spray Place 2 sprays into both nostrils  daily. 04/21/18   Lutricia Feil, PA-C    Family History Family History  Problem Relation Age of Onset  . Cancer Maternal Aunt   . Cancer Paternal Grandmother   . Stroke Paternal Grandmother   . Heart disease Paternal Grandfather     Social History Social History   Tobacco Use  . Smoking status: Never Smoker  . Smokeless tobacco: Never Used  Substance Use Topics  . Alcohol use: No  . Drug use: No     Allergies   Patient has no known allergies.   Review of Systems Review of Systems  Constitutional: Positive for activity change. Negative for chills, fatigue and fever.  HENT: Positive for congestion, postnasal drip, rhinorrhea, sinus pressure and sinus pain.   Respiratory: Positive for cough and chest tightness. Negative for shortness of breath and wheezing.   All other systems reviewed and are negative.    Physical Exam Triage Vital Signs ED Triage Vitals  Enc Vitals Group     BP 04/21/18 1219 121/81     Pulse Rate  04/21/18 1219 82     Resp 04/21/18 1219 18     Temp 04/21/18 1219 98 F (36.7 C)     Temp Source 04/21/18 1219 Oral     SpO2 04/21/18 1219 99 %     Weight 04/21/18 1220 271 lb (122.9 kg)     Height 04/21/18 1220 5\' 4"  (1.626 m)     Head Circumference --      Peak Flow --      Pain Score 04/21/18 1220 2     Pain Loc --      Pain Edu? --      Excl. in GC? --    No data found.  Updated Vital Signs BP 121/81 (BP Location: Left Arm)   Pulse 82   Temp 98 F (36.7 C) (Oral)   Resp 18   Ht 5\' 4"  (1.626 m)   Wt 271 lb (122.9 kg)   SpO2 99%   BMI 46.52 kg/m   Visual Acuity Right Eye Distance:   Left Eye Distance:   Bilateral Distance:    Right Eye Near:   Left Eye Near:    Bilateral Near:     Physical Exam Vitals signs and nursing note reviewed.  Constitutional:      General: She is not in acute distress.    Appearance: Normal appearance. She is obese. She is not ill-appearing, toxic-appearing or diaphoretic.  HENT:     Head: Normocephalic.     Right Ear: Tympanic membrane, ear canal and external ear normal.     Left Ear: Tympanic membrane, ear canal and external ear normal.     Nose: Congestion present.     Mouth/Throat:     Mouth: Mucous membranes are moist.     Pharynx: No oropharyngeal exudate or posterior oropharyngeal erythema.  Eyes:     General:        Right eye: No discharge.        Left eye: No discharge.     Pupils: Pupils are equal, round, and reactive to light.  Neck:     Musculoskeletal: Normal range of motion and neck supple.  Pulmonary:     Effort: Pulmonary effort is normal.     Breath sounds: Normal breath sounds.  Musculoskeletal: Normal range of motion.  Lymphadenopathy:     Cervical: No cervical adenopathy.  Skin:    General: Skin is warm and dry.  Neurological:  General: No focal deficit present.     Mental Status: She is alert and oriented to person, place, and time.  Psychiatric:        Mood and Affect: Mood normal.         Behavior: Behavior normal.      UC Treatments / Results  Labs (all labs ordered are listed, but only abnormal results are displayed) Labs Reviewed - No data to display  EKG None  Radiology No results found.  Procedures Procedures (including critical care time)  Medications Ordered in UC Medications - No data to display  Initial Impression / Assessment and Plan / UC Course  I have reviewed the triage vital signs and the nursing notes.  Pertinent labs & imaging results that were available during my care of the patient were reviewed by me and considered in my medical decision making (see chart for details).   Patient this is likely viral upper respiratory infection does not require antibiotics.  Her symptomatically for her cough symptoms.  Recommended Flonase nasal spray for 2 to 3 weeks.  She is not improving she should follow-up with her primary care physician   Final Clinical Impressions(s) / UC Diagnoses   Final diagnoses:  Acute upper respiratory infection     Discharge Instructions     Use Flonase daily for the next 2 to 3 weeks.  Get plenty of rest and drink fluids.    ED Prescriptions    Medication Sig Dispense Auth. Provider   benzonatate (TESSALON) 200 MG capsule Take one cap TID PRN cough 30 capsule Lutricia Feiloemer, William P, PA-C   chlorpheniramine-HYDROcodone (TUSSIONEX PENNKINETIC ER) 10-8 MG/5ML SUER Take 5 mLs by mouth 2 (two) times daily. 115 mL Ovid Curdoemer, William P, PA-C   fluticasone (FLONASE) 50 MCG/ACT nasal spray Place 2 sprays into both nostrils daily. 16 g Lutricia Feiloemer, William P, PA-C     Controlled Substance Prescriptions [place] Controlled Substance Registry consulted? Not Applicable   Lutricia FeilRoemer, William P, PA-C 04/21/18 1342

## 2018-04-21 NOTE — Discharge Instructions (Signed)
Use Flonase daily for the next 2 to 3 weeks.  Get plenty of rest and drink fluids.

## 2018-04-21 NOTE — ED Triage Notes (Signed)
Patient c/o cough / nasal congestion. Per patient has history of asthma which is not treated. Per patient only taken albuterol inhaler for her asthma.

## 2018-07-09 ENCOUNTER — Other Ambulatory Visit: Payer: Self-pay | Admitting: Obstetrics and Gynecology

## 2018-07-09 DIAGNOSIS — R5383 Other fatigue: Secondary | ICD-10-CM

## 2018-07-09 DIAGNOSIS — N951 Menopausal and female climacteric states: Secondary | ICD-10-CM

## 2018-07-09 NOTE — Telephone Encounter (Signed)
Labs appointment orders in

## 2018-07-10 ENCOUNTER — Other Ambulatory Visit: Payer: 59

## 2018-07-10 DIAGNOSIS — N951 Menopausal and female climacteric states: Secondary | ICD-10-CM

## 2018-07-10 DIAGNOSIS — R5383 Other fatigue: Secondary | ICD-10-CM

## 2018-07-11 LAB — THYROID PANEL WITH TSH
FREE THYROXINE INDEX: 2.5 (ref 1.2–4.9)
T3 UPTAKE RATIO: 25 % (ref 24–39)
T4, Total: 9.8 ug/dL (ref 4.5–12.0)
TSH: 1.3 u[IU]/mL (ref 0.450–4.500)

## 2018-07-11 LAB — MONONUCLEOSIS SCREEN: Mono Screen: NEGATIVE

## 2018-07-11 LAB — VITAMIN B12: Vitamin B-12: 770 pg/mL (ref 232–1245)

## 2018-07-11 LAB — ESTRADIOL: ESTRADIOL: 8.5 pg/mL

## 2018-07-11 LAB — FOLLICLE STIMULATING HORMONE: FSH: 45 m[IU]/mL

## 2018-07-11 LAB — VITAMIN D 25 HYDROXY (VIT D DEFICIENCY, FRACTURES): Vit D, 25-Hydroxy: 61.5 ng/mL (ref 30.0–100.0)

## 2018-07-23 ENCOUNTER — Encounter: Payer: Self-pay | Admitting: Obstetrics and Gynecology

## 2018-07-23 ENCOUNTER — Other Ambulatory Visit: Payer: Self-pay

## 2018-07-23 ENCOUNTER — Ambulatory Visit: Payer: 59 | Admitting: Obstetrics and Gynecology

## 2018-07-23 VITALS — BP 114/68 | Ht 64.0 in | Wt 262.0 lb

## 2018-07-23 DIAGNOSIS — N951 Menopausal and female climacteric states: Secondary | ICD-10-CM

## 2018-07-23 MED ORDER — ESTROGENS CONJUGATED 0.3 MG PO TABS: 0.3000 mg | ORAL_TABLET | Freq: Every day | ORAL | 11 refills | Status: DC

## 2018-07-23 NOTE — Patient Instructions (Signed)
We discussed WHI study findings in detail.  In the combined estrogen-progesterone arm breast cancer risk was increased by 1.26 (CI of 1.00 to 1.59), coronary heart disease 1.29 (CI 1.02-1.63), stroke risk 1.41 (1.07-1.85), and pulmonary embolism 2.13 (CI 1.39-3.25).  That being said the while statistically significant the actual number of cases attributable are relatively small at an addition 8 cases of breast cancer, 7 more coronary artery event, 8 more strokes, and 8 additional case of pulmonary embolism per 10,000 women.  Study was terminated because of the increased breast cancer risk, this was not seen in the progestin only arm of the study for women without an intact uterus.  In addition it is important to note that HRT also had positive or risk reducing effects, and all cause mortality between the HRT/non-HRT users is not statistically different.  Estrogen-progestin HRT decreased the relative risk of hip fracture 0.66 (CI 0.45-0.98), colorectal cancer 0.63 (0.43-0.92).  Current consensus is to limit dose to the lowest effective dose, and shortest treatment duration possible.  Breast cancer risk appeared to increase after 4 years of use.  Also important to note is that these risk refer to systemic HRT for the treatment of vasomotor symptoms, and do not apply to vaginal preperations with minimal systemic absorption and aimed at treating symptoms of vulvovaginal atrophy.    We briefly touched on findings of WHIMS trial published in 2005 which looked at women 65 year of age or older, and whether HRT was protective against the development of dementia.  The study revealed that HRT actually increased the risk for the development of dementia but was limited by looking only at patients 65 years of age and older.  The subsequent KEEPS trial  In 2012 which looked at HRT in recently postmenopausal women did not show any improvement in cognitive function for women on HRT.  However, there was also no significant  cognitive declines seen in recently postmenopausal women receiving HRT as had previously been shown in the WHIMS trial.    

## 2018-07-23 NOTE — Progress Notes (Signed)
Obstetrics & Gynecology Office Visit   Chief Complaint:  Chief Complaint  Patient presents with   Menopause    Discuss HRT    History of Present Illness: 48 y.o. G2P2 presenting for initial evaluation of menopausal symptoms. Symptom onset was several months ago and symptoms have been stable.  The patient is not still menstruating.  She is currently on any HRT or hormonal medications, norethindrone.  Her surgical history is notable for intact uterus and ovaries.  She reports amenorrhea with no bleeding concerns.  Vasomotor symptoms significant with pronounced sleep disturbances, and limitations in quality of life.  Associated symptoms include fatigue, memory problems, vaginal dryness and dyspareunia.  Contributory past medical history includes hypertension, thyroid disease, breast cancer history, heart disease, stroke and personal history of DVT or PE.  She has not previously self treated or been under treatment by another provider for these symptoms.     Review of Systems: Review of Systems  Constitutional: Positive for diaphoresis. Negative for chills, fever, malaise/fatigue and weight loss.  Gastrointestinal: Negative.   Genitourinary: Negative.     Past Medical History:  Past Medical History:  Diagnosis Date   Allergy    Anxiety    Asthma    Depression    Hashimoto's disease     Past Surgical History:  Past Surgical History:  Procedure Laterality Date   ANKLE SURGERY     tendon repair with incison in L calf    CESAREAN SECTION     2x   OOPHORECTOMY Left 08/2015   removed due to torsion with benign mass    Gynecologic History: No LMP recorded. (Menstrual status: Oral contraceptives).  Obstetric History: G2P2  Family History:  Family History  Problem Relation Age of Onset   Cancer Maternal Aunt    Cancer Paternal Grandmother    Stroke Paternal Grandmother    Heart disease Paternal Grandfather     Social History:  Social History    Socioeconomic History   Marital status: Divorced    Spouse name: Not on file   Number of children: Not on file   Years of education: Not on file   Highest education level: Not on file  Occupational History   Not on file  Social Needs   Financial resource strain: Not on file   Food insecurity:    Worry: Not on file    Inability: Not on file   Transportation needs:    Medical: Not on file    Non-medical: Not on file  Tobacco Use   Smoking status: Never Smoker   Smokeless tobacco: Never Used  Substance and Sexual Activity   Alcohol use: No   Drug use: No   Sexual activity: Not Currently    Partners: Male    Birth control/protection: Injection  Lifestyle   Physical activity:    Days per week: Not on file    Minutes per session: Not on file   Stress: Not on file  Relationships   Social connections:    Talks on phone: Not on file    Gets together: Not on file    Attends religious service: Not on file    Active member of club or organization: Not on file    Attends meetings of clubs or organizations: Not on file    Relationship status: Not on file   Intimate partner violence:    Fear of current or ex partner: Not on file    Emotionally abused: Not on file  Physically abused: Not on file    Forced sexual activity: Not on file  Other Topics Concern   Not on file  Social History Narrative   Not on file    Allergies:  No Known Allergies  Medications: Prior to Admission medications   Medication Sig Start Date End Date Taking? Authorizing Provider  Ascorbic Acid (VITAMIN C) 1000 MG tablet Take 1,000 mg by mouth daily.   Yes [provider]  b complex vitamins capsule Take 1 capsule by mouth daily.   Yes [provider]  cholecalciferol (VITAMIN D) 1000 units tablet Take 1,000 Units by mouth daily.   Yes [provider]  citalopram (CELEXA) 10 MG tablet Take 10 mg by mouth daily.   Yes [provider]   DULoxetine (CYMBALTA) 60 MG capsule Take by mouth.   Yes [provider]  fluticasone (FLONASE) 50 MCG/ACT nasal spray Place 2 sprays into both nostrils daily. 04/21/18  Yes Lutricia Feil, PA-C  LamoTRIgine (LAMICTAL PO) Take 60 mg by mouth.   Yes [provider]  Levothyroxine Sodium (TIROSINT) 200 MCG CAPS Take by mouth. 12/07/17  Yes [provider]  norethindrone (MICRONOR,CAMILA,ERRIN) 0.35 MG tablet Take 1 tablet (0.35 mg total) by mouth daily. 01/21/18  Yes [name], MD  phentermine (ADIPEX-P) 37.5 MG tablet Take by mouth.   Yes [provider]  PROAIR HFA 108 [phone] Base) MCG/ACT inhaler  09/09/16  Yes [provider]  Semaglutide 0.25 or 0.5 MG/DOSE SOPN Inject into the skin.   Yes [provider]  traZODone (DESYREL) 150 MG tablet Take by mouth at bedtime.   Yes [provider]  TROKENDI XR 50 MG CP24 TK ONE C PO D 12/15/17  Yes [provider]    Physical Exam Vitals: Blood pressure 114/68, height  (1.626 m), weight 262 lb (118.8 kg).  No LMP recorded. (Menstrual status: Oral contraceptives).  General: NAD, well nourished, appears states age HEENT: normocephalic, anicteric Pulmonary: No increased work of breathing Extremities: no edema, erythema, or tenderness Neurologic: Grossly intact Psychiatric: mood appropriate, affect full  Assessment: 48 y.o. G2P2 presenting for evaluation of vasomotor symptoms  Plan: Problem List Items Addressed This Visit    None     We discussed WHI study findings in detail.  In the combined estrogen-progesterone arm breast cancer risk was increased by 1.26 (CI of 1.00 to 1.59), coronary heart disease 1.29 (CI 1.02-1.63), stroke risk 1.41 (1.07-1.85), and pulmonary embolism 2.13 (CI 1.39-3.25).  That being said the while statistically significant the actual number of cases attributable are relatively small at an addition 8 cases of breast cancer, 7 more coronary  artery event, 8 more strokes, and 8 additional case of pulmonary embolism per 10,000 women.  Study was terminated because of the increased breast cancer risk, this was not seen in the progestin only arm of the study for women without an intact uterus.  In addition it is important to note that HRT also had positive or risk reducing effects, and all cause mortality between the HRT/non-HRT users is not statistically different.  Estrogen-progestin HRT decreased the relative risk of hip fracture 0.66 (CI 0.45-0.98), colorectal cancer 0.63 (0.43-0.92).  Current consensus is to limit dose to the lowest effective dose, and shortest treatment duration possible.  Breast cancer risk appeared to increase after 4 years of use.  Also important to note is that these risk refer to systemic HRT for the treatment of vasomotor symptoms, and do not apply to vaginal  preperations with minimal systemic absorption and aimed at treating symptoms of vulvovaginal atrophy.    We briefly touched on findings of WHIMS trial published in 2005 which looked at women 67 year of age or older, and whether HRT was protective against the development of dementia.  The study revealed that HRT actually increased the risk for the development of dementia but was limited by looking only at patients 26 years of age and older.  The subsequent KEEPS trial  In 2012 which looked at HRT in recently postmenopausal women did not show any improvement in cognitive function for women on HRT.  However, there was also no significant cognitive declines seen in recently postmenopausal women receiving HRT as had previously been shown in the WHIMS trial.     [name], MD, [facility], [facility] 07/23/2018, 2:20 PM

## 2018-07-26 MED ORDER — PROGESTERONE MICRONIZED 100 MG PO CAPS: 100.0000 mg | ORAL_CAPSULE | Freq: Every day | ORAL | 3 refills | Status: DC

## 2018-09-02 ENCOUNTER — Encounter: Payer: Self-pay | Admitting: Obstetrics and Gynecology

## 2018-09-02 ENCOUNTER — Ambulatory Visit: Payer: 59 | Admitting: Obstetrics and Gynecology

## 2018-09-02 ENCOUNTER — Other Ambulatory Visit: Payer: Self-pay

## 2018-09-02 VITALS — BP 110/72 | HR 96 | Wt 252.0 lb

## 2018-09-02 DIAGNOSIS — N951 Menopausal and female climacteric states: Secondary | ICD-10-CM

## 2018-09-02 DIAGNOSIS — Z79890 Hormone replacement therapy (postmenopausal): Secondary | ICD-10-CM

## 2018-09-02 MED ORDER — ESTROGENS CONJUGATED 0.625 MG PO TABS: 0.6250 mg | ORAL_TABLET | Freq: Every day | ORAL | 3 refills | Status: DC

## 2018-09-02 NOTE — Progress Notes (Signed)
Obstetrics & Gynecology Office Visit   Chief Complaint:  Chief Complaint  Patient presents with  . Follow-up    Doing well    History of Present Illness: 48 y.o. G2P2 presenting for medication follow up for a diagnosis of vasomotor symptoms associated with menopause",vasomotor symptoms associated with perimenpause.  She is currently being managed with systemic HRT.   The patient reports improvement in symptoms but continued mild vasomotor symptoms and sleep disturbances.  On her current medication regimen has not had menstrual issues secondary to postmenopausal status.   She has not noted any side-effects or new symptoms.    Review of Systems: Review of Systems  Constitutional: Negative.   Genitourinary: Negative.   Psychiatric/Behavioral: Negative.     Past Medical History:  Past Medical History:  Diagnosis Date  . Allergy   . Anxiety   . Asthma   . Depression   . Hashimoto's disease     Past Surgical History:  Past Surgical History:  Procedure Laterality Date  . ANKLE SURGERY     tendon repair with incison in L calf   . CESAREAN SECTION     2x  . OOPHORECTOMY Left 08/2015   removed due to torsion with benign mass    Gynecologic History: No LMP recorded. (Menstrual status: Oral contraceptives).  Obstetric History: G2P2  Family History:  Family History  Problem Relation Age of Onset  . Cancer Maternal Aunt   . Cancer Paternal Grandmother   . Stroke Paternal Grandmother   . Heart disease Paternal Grandfather     Social History:  Social History   Socioeconomic History  . Marital status: Divorced    Spouse name: Not on file  . Number of children: Not on file  . Years of education: Not on file  . Highest education level: Not on file  Occupational History  . Not on file  Social Needs  . Financial resource strain: Not on file  . Food insecurity:    Worry: Not on file    Inability: Not on file  . Transportation needs:    Medical: Not on file   Non-medical: Not on file  Tobacco Use  . Smoking status: Never Smoker  . Smokeless tobacco: Never Used  Substance and Sexual Activity  . Alcohol use: No  . Drug use: No  . Sexual activity: Not Currently    Partners: Male    Birth control/protection: Injection  Lifestyle  . Physical activity:    Days per week: Not on file    Minutes per session: Not on file  . Stress: Not on file  Relationships  . Social connections:    Talks on phone: Not on file    Gets together: Not on file    Attends religious service: Not on file    Active member of club or organization: Not on file    Attends meetings of clubs or organizations: Not on file    Relationship status: Not on file  . Intimate partner violence:    Fear of current or ex partner: Not on file    Emotionally abused: Not on file    Physically abused: Not on file    Forced sexual activity: Not on file  Other Topics Concern  . Not on file  Social History Narrative  . Not on file    Allergies:  No Known Allergies  Medications: Prior to Admission medications   Medication Sig Start Date End Date Taking? Authorizing Provider  Ascorbic Acid (VITAMIN  C) 1000 MG tablet Take 1,000 mg by mouth daily.   Yes [provider]  b complex vitamins capsule Take 1 capsule by mouth daily.   Yes [provider]  cholecalciferol (VITAMIN D) 1000 units tablet Take 1,000 Units by mouth daily.   Yes [provider]  citalopram (CELEXA) 10 MG tablet Take 10 mg by mouth daily.   Yes [provider]  DULoxetine (CYMBALTA) 60 MG capsule Take by mouth.   Yes [provider]  estrogens, conjugated, (PREMARIN) 0.3 MG tablet Take 1 tablet (0.3 mg total) by mouth daily. 07/23/18  Yes [name], MD  fluticasone (FLONASE) 50 MCG/ACT nasal spray Place 2 sprays into both nostrils daily. 04/21/18  Yes Lutricia Feil, PA-C  LamoTRIgine (LAMICTAL PO) Take 60 mg by mouth.   Yes [provider]   Levothyroxine Sodium (TIROSINT) 200 MCG CAPS Take by mouth. 12/07/17  Yes [provider]  phentermine (ADIPEX-P) 37.5 MG tablet Take by mouth.   Yes [provider]  PROAIR HFA 108 [phone] Base) MCG/ACT inhaler  09/09/16  Yes [provider]  progesterone (PROMETRIUM) 100 MG capsule Take 1 capsule (100 mg total) by mouth daily. 07/26/18  Yes [name], MD  Semaglutide 0.25 or 0.5 MG/DOSE SOPN Inject into the skin.   Yes [provider]  traZODone (DESYREL) 150 MG tablet Take by mouth at bedtime.   Yes [provider]  TROKENDI XR 50 MG CP24 TK ONE C PO D 12/15/17  Yes [provider]    Physical Exam Vitals:  Vitals:   09/02/18 1340  BP: 110/72  Pulse: 96   No LMP recorded. (Menstrual status: Oral contraceptives).  General: NAD, well nourished, appears stated age HEENT: normocephalic, anicteric Pulmonary: No increased work of breathing Neurologic: Grossly intact Psychiatric: mood appropriate, affect full  Assessment: 48 y.o. G2P2 HRT management follow up  Plan: Problem List Items Addressed This Visit    None    Visit Diagnoses    Hormone replacement therapy, postmenopausal    -  Primary   Vasomotor symptoms due to menopause         1) Vasomotor symptoms - improved but still some residual symptoms.  Increase premarin to 0.625 mg po daily, continue prometrium 100mg  po daily  2) A total of 15 minutes were spent in face-to-face contact with the patient during this encounter with over half of that time devoted to counseling and coordination of care.  3) Return in about 6 weeks (around 10/14/2018) for 6-8 week for medication follow up.   [name], MD, [facility], [facility] 09/02/2018, 1:59 PM

## 2018-09-03 ENCOUNTER — Ambulatory Visit: Payer: 59 | Admitting: Obstetrics and Gynecology

## 2018-10-16 ENCOUNTER — Other Ambulatory Visit: Payer: Self-pay

## 2018-10-16 ENCOUNTER — Ambulatory Visit (INDEPENDENT_AMBULATORY_CARE_PROVIDER_SITE_OTHER): Payer: 59 | Admitting: Obstetrics and Gynecology

## 2018-10-16 DIAGNOSIS — N951 Menopausal and female climacteric states: Secondary | ICD-10-CM

## 2018-10-16 MED ORDER — ESTROGENS CONJUGATED 0.9 MG PO TABS: 0.9000 mg | ORAL_TABLET | Freq: Every day | ORAL | 3 refills | Status: DC

## 2018-10-16 NOTE — Progress Notes (Signed)
I connected with Grecia L Stretch on 10/17/18 at  1:30 PM EDT by telephone and verified that I am speaking with the correct person using two identifiers.   I discussed the limitations, risks, security and privacy concerns of performing an evaluation and management service by telephone and the availability of in person appointments. I also discussed with the patient that there may be a patient responsible charge related to this service. The patient expressed understanding and agreed to proceed.  The patient was at home I spoke with the patient from my workstation phone The names of people involved in this encounter were: Rhyan L Russ , and Vena AustriaAndreas Reed Dady   Obstetrics & Gynecology Office Visit   Chief Complaint: No chief complaint on file.   History of Present Illness: 48 y.o. G2P2 presenting for medication follow up for a diagnosis of vasomotor symptoms associated with menopause",vasomotor symptoms associated with perimenpause.  She is currently being managed with systemic HRT.   The patient reports good control of symptoms on her current regimen.  On her current medication regimen has not had menstrual issues secondary to postmenopausal status.   She has not noted any side-effects or new symptoms.    Good further reduction in vasomotor symptoms.  However, still some night time vasomotor symptoms.    Review of Systems: Review of Systems  Constitutional: Negative.   Genitourinary: Negative.   Neurological: Negative for headaches.   Past Medical History:  Past Medical History:  Diagnosis Date  . Allergy   . Anxiety   . Asthma   . Depression   . Hashimoto's disease     Past Surgical History:  Past Surgical History:  Procedure Laterality Date  . ANKLE SURGERY     tendon repair with incison in L calf   . CESAREAN SECTION     2x  . OOPHORECTOMY Left 08/2015   removed due to torsion with benign mass    Gynecologic History: No LMP recorded. (Menstrual status: Oral  contraceptives).  Obstetric History: G2P2  Family History:  Family History  Problem Relation Age of Onset  . Cancer Maternal Aunt   . Cancer Paternal Grandmother   . Stroke Paternal Grandmother   . Heart disease Paternal Grandfather     Social History:  Social History   Socioeconomic History  . Marital status: Divorced    Spouse name: Not on file  . Number of children: Not on file  . Years of education: Not on file  . Highest education level: Not on file  Occupational History  . Not on file  Social Needs  . Financial resource strain: Not on file  . Food insecurity    Worry: Not on file    Inability: Not on file  . Transportation needs    Medical: Not on file    Non-medical: Not on file  Tobacco Use  . Smoking status: Never Smoker  . Smokeless tobacco: Never Used  Substance and Sexual Activity  . Alcohol use: No  . Drug use: No  . Sexual activity: Not Currently    Partners: Male    Birth control/protection: Injection  Lifestyle  . Physical activity    Days per week: Not on file    Minutes per session: Not on file  . Stress: Not on file  Relationships  . Social Musicianconnections    Talks on phone: Not on file    Gets together: Not on file    Attends religious service: Not on file    Active  member of club or organization: Not on file    Attends meetings of clubs or organizations: Not on file    Relationship status: Not on file  . Intimate partner violence    Fear of current or ex partner: Not on file    Emotionally abused: Not on file    Physically abused: Not on file    Forced sexual activity: Not on file  Other Topics Concern  . Not on file  Social History Narrative  . Not on file    Allergies:  No Known Allergies  Medications: Prior to Admission medications   Medication Sig Start Date End Date Taking? Authorizing Provider  Ascorbic Acid (VITAMIN C) 1000 MG tablet Take 1,000 mg by mouth daily.    [provider]  b complex vitamins capsule  Take 1 capsule by mouth daily.    [provider]  cholecalciferol (VITAMIN D) 1000 units tablet Take 1,000 Units by mouth daily.    [provider]  citalopram (CELEXA) 10 MG tablet Take 10 mg by mouth daily.    [provider]  DULoxetine (CYMBALTA) 60 MG capsule Take by mouth.    [provider]  estrogens, conjugated, (PREMARIN) 0.9 MG tablet Take 1 tablet (0.9 mg total) by mouth daily. 10/16/18   Malachy Mood, MD  fluticasone (FLONASE) 50 MCG/ACT nasal spray Place 2 sprays into both nostrils daily. 04/21/18   Lorin Picket, PA-C  LamoTRIgine (LAMICTAL PO) Take 60 mg by mouth.    [provider]  Levothyroxine Sodium (TIROSINT) 200 MCG CAPS Take by mouth. 12/07/17   [provider]  phentermine (ADIPEX-P) 37.5 MG tablet Take by mouth.    [provider]  PROAIR HFA 108 [phone] Base) MCG/ACT inhaler  09/09/16   [provider]  progesterone (PROMETRIUM) 100 MG capsule Take 1 capsule (100 mg total) by mouth daily. 07/26/18   Malachy Mood, MD  Semaglutide 0.25 or 0.5 MG/DOSE SOPN Inject into the skin.    [provider]  traZODone (DESYREL) 150 MG tablet Take by mouth at bedtime.    [provider]  TROKENDI XR 50 MG CP24 TK ONE C PO D 12/15/17   [provider]    Physical Exam Vitals: There were no vitals filed for this visit. No LMP recorded. (Menstrual status: Oral contraceptives).  No physical exam as this was a remote telephone visit to promote social distancing during the current COVID-19 Pandemic   Assessment: 48 y.o. G2P2 follow up vasomotor symptoms  Plan: Problem List Items Addressed This Visit    None    Visit Diagnoses    Vasomotor symptoms due to menopause    -  Primary     1) Vasomotor symptoms - Increase premarin to 0.9mg , contine prometrium 100mg  daily - good improvement with some remaining night time symptoms but feels more rested  2) Telephone Time 6:32  3)  Return in about 8 weeks (around 12/11/2018) for medication follow up.   Malachy Mood, MD, [facility], [place]

## 2019-01-10 ENCOUNTER — Other Ambulatory Visit: Payer: Self-pay | Admitting: Obstetrics and Gynecology

## 2019-01-16 ENCOUNTER — Ambulatory Visit (INDEPENDENT_AMBULATORY_CARE_PROVIDER_SITE_OTHER): Payer: 59 | Admitting: Obstetrics and Gynecology

## 2019-01-16 ENCOUNTER — Other Ambulatory Visit: Payer: Self-pay

## 2019-01-16 ENCOUNTER — Encounter: Payer: Self-pay | Admitting: Obstetrics and Gynecology

## 2019-01-16 VITALS — BP 112/77 | Ht 64.0 in | Wt 233.0 lb

## 2019-01-16 DIAGNOSIS — Z113 Encounter for screening for infections with a predominantly sexual mode of transmission: Secondary | ICD-10-CM

## 2019-01-16 DIAGNOSIS — F32A Depression, unspecified: Secondary | ICD-10-CM

## 2019-01-16 DIAGNOSIS — F329 Major depressive disorder, single episode, unspecified: Secondary | ICD-10-CM

## 2019-01-16 DIAGNOSIS — Z01419 Encounter for gynecological examination (general) (routine) without abnormal findings: Secondary | ICD-10-CM | POA: Diagnosis not present

## 2019-01-16 DIAGNOSIS — Z124 Encounter for screening for malignant neoplasm of cervix: Secondary | ICD-10-CM

## 2019-01-16 DIAGNOSIS — Z1239 Encounter for other screening for malignant neoplasm of breast: Secondary | ICD-10-CM

## 2019-01-16 MED ORDER — ESTROGENS CONJUGATED 0.9 MG PO TABS: 0.9000 mg | ORAL_TABLET | Freq: Every day | ORAL | 3 refills | Status: DC

## 2019-01-16 MED ORDER — PROGESTERONE MICRONIZED 100 MG PO CAPS: 100.0000 mg | ORAL_CAPSULE | Freq: Every day | ORAL | 3 refills | Status: DC

## 2019-01-16 MED ORDER — BUSPIRONE HCL 10 MG PO TABS: 10.0000 mg | ORAL_TABLET | Freq: Two times a day (BID) | ORAL | 2 refills | Status: DC

## 2019-01-16 NOTE — Progress Notes (Signed)
Gynecology Annual Exam  PCP: Yvette RackHoward, Athena Auvil, MD  Chief Complaint:  Chief Complaint  Patient presents with  . Gynecologic Exam    History of Present Illness: Patient is a 48 y.o. G2P2 presents for annual exam. The patient has no complaints today.   LMP: No LMP recorded. (Menstrual status: Oral contraceptives). Absent on Mirena IUD  The patient is not currently sexually active. She currently uses post menopausal status for contraception.  The patient does perform self breast exams.  There is no notable family history of breast or ovarian cancer in her family.  The patient wears seatbelts: yes.   The patient has regular exercise: yes.  Has lost almost 60lbs  The patient reports current symptoms of depression.  Mostly anxiety currently on cymbalta 60mg  and celexa 10mg .  A lost of her symptoms are stiuational including working 60-hr week at work,  Review of Systems: Review of Systems  Constitutional: Negative.  Negative for chills and fever.  HENT: Negative for congestion.   Respiratory: Negative for cough and shortness of breath.   Cardiovascular: Negative for chest pain and palpitations.  Gastrointestinal: Negative for abdominal pain, constipation, diarrhea, heartburn, nausea and vomiting.  Genitourinary: Negative for dysuria, frequency and urgency.  Skin: Negative for itching and rash.  Neurological: Negative for dizziness and headaches.  Endo/Heme/Allergies: Negative for polydipsia.  Psychiatric/Behavioral: Negative for depression, hallucinations, memory loss, substance abuse and suicidal ideas. The patient is nervous/anxious and has insomnia.     Past Medical History:  Past Medical History:  Diagnosis Date  . Allergy   . Anxiety   . Asthma   . Depression   . Hashimoto's disease     Past Surgical History:  Past Surgical History:  Procedure Laterality Date  . ANKLE SURGERY     tendon repair with incison in L calf   . CESAREAN SECTION     2x  . OOPHORECTOMY  Left 08/2015   removed due to torsion with benign mass    Gynecologic History:  No LMP recorded. (Menstrual status: Oral contraceptives). Contraception: postmenopausal Last Pap: Results were: 01/03/2018 NIL and HR HPV negative  [facility]  Obstetric History: G2P2  Family History:  Family History  Problem Relation Age of Onset  . Cancer Maternal Aunt 50       Colon  . Stroke Paternal Grandmother   . Cancer Paternal Grandmother        Brain and Kidney  . Heart disease Paternal Grandfather     Social History:  Social History   Socioeconomic History  . Marital status: Divorced    Spouse name: Not on file  . Number of children: Not on file  . Years of education: Not on file  . Highest education level: Not on file  Occupational History  . Not on file  Social Needs  . Financial resource strain: Not on file  . Food insecurity    Worry: Not on file    Inability: Not on file  . Transportation needs    Medical: Not on file    Non-medical: Not on file  Tobacco Use  . Smoking status: Never Smoker  . Smokeless tobacco: Never Used  Substance and Sexual Activity  . Alcohol use: No  . Drug use: No  . Sexual activity: Not Currently    Partners: Male    Birth control/protection: Injection  Lifestyle  . Physical activity    Days per week: Not on file    Minutes per  session: Not on file  . Stress: Not on file  Relationships  . Social Musician on phone: Not on file    Gets together: Not on file    Attends religious service: Not on file    Active member of club or organization: Not on file    Attends meetings of clubs or organizations: Not on file    Relationship status: Not on file  . Intimate partner violence    Fear of current or ex partner: Not on file    Emotionally abused: Not on file    Physically abused: Not on file    Forced sexual activity: Not on file  Other Topics Concern  . Not on file  Social History Narrative  . Not on  file    Allergies:  No Known Allergies  Medications: Prior to Admission medications   Medication Sig Start Date End Date Taking? Authorizing Provider  Ascorbic Acid (VITAMIN C) 1000 MG tablet Take 1,000 mg by mouth daily.    [provider]  b complex vitamins capsule Take 1 capsule by mouth daily.    [provider]  cholecalciferol (VITAMIN D) 1000 units tablet Take 1,000 Units by mouth daily.    [provider]  citalopram (CELEXA) 10 MG tablet Take 10 mg by mouth daily.    [provider]  DULoxetine (CYMBALTA) 60 MG capsule Take by mouth.    [provider]  estrogens, conjugated, (PREMARIN) 0.9 MG tablet Take 1 tablet (0.9 mg total) by mouth daily. 10/16/18   [name], MD  fluticasone (FLONASE) 50 MCG/ACT nasal spray Place 2 sprays into both nostrils daily. 04/21/18   Lutricia Feil, PA-C  LamoTRIgine (LAMICTAL PO) Take 60 mg by mouth.    [provider]  Levothyroxine Sodium (TIROSINT) 200 MCG CAPS Take by mouth. 12/07/17   [provider]  norethindrone (MICRONOR) 0.35 MG tablet TAKE 1 TABLET BY MOUTH DAILY 01/10/19   [name], MD  phentermine (ADIPEX-P) 37.5 MG tablet Take by mouth.    [provider]  PROAIR HFA 108 [phone] Base) MCG/ACT inhaler  09/09/16   [provider]  progesterone (PROMETRIUM) 100 MG capsule Take 1 capsule (100 mg total) by mouth daily. 07/26/18   [name], MD  Semaglutide 0.25 or 0.5 MG/DOSE SOPN Inject into the skin.    [provider]  traZODone (DESYREL) 150 MG tablet Take by mouth at bedtime.    [provider]  TROKENDI XR 50 MG CP24 TK ONE C PO D 12/15/17   [provider]    Physical Exam Vitals: Blood pressure 112/77, height 5\' 4"  (1.626 m), weight 233 lb (105.7 kg).   General: NAD HEENT: normocephalic, anicteric Thyroid: no enlargement, no palpable nodules Pulmonary: No increased work of breathing, CTAB  Cardiovascular: RRR, distal pulses 2+ Breast: Breast symmetrical, no tenderness, no palpable nodules or masses, no skin or nipple retraction present, no nipple discharge.  No axillary or supraclavicular lymphadenopathy. Abdomen: NABS, soft, non-tender, non-distended.  Umbilicus without lesions.  No hepatomegaly, splenomegaly or masses palpable. No evidence of hernia  Genitourinary:  External: Normal external female genitalia.  Normal urethral meatus, normal Bartholin's and Skene's glands.    Vagina: Normal vaginal mucosa, no evidence of prolapse.    Cervix: Grossly normal in appearance, no bleeding  Uterus: Non-enlarged, mobile, normal contour.  No CMT  Adnexa: ovaries non-enlarged, no adnexal masses  Rectal: deferred  Lymphatic: no evidence of inguinal lymphadenopathy Extremities: no edema,  erythema, or tenderness Neurologic: Grossly intact Psychiatric: mood appropriate, affect full  Female chaperone present for pelvic and breast  portions of the physical exam    Assessment: 48 y.o. G2P2 routine annual exam  Plan: Problem List Items Addressed This Visit    None    Visit Diagnoses    Encounter for gynecological examination without abnormal finding    -  Primary   Screening for malignant neoplasm of cervix       Relevant Orders   Cytology - PAP   Breast screening       Anxiety and depression       Relevant Medications   busPIRone (BUSPAR) 10 MG tablet      1) Mammogram - recommend yearly screening mammogram.  Mammogram Is up to date  - being done at [place]  2) STI screening  was notoffered and therefore not obtained  3) ASCCP guidelines and rational discussed.  Patient opts for yearly screening interval  4) Contraception  She is not in need of contraception secondary to postmenopausal status based on FSH/estradiol values  5) Colonoscopy -- Screening recommended starting at age 29 for average risk individuals, age 16 for individuals deemed at increased risk (including African  Americans) and recommended to continue until age 38.  For patient age 58-85 individualized approach is recommended.  Gold standard screening is via colonoscopy, Cologuard screening is an acceptable alternative for patient unwilling or unable to undergo colonoscopy.  "Colorectal cancer screening for average?risk adults: 2018 guideline update from the American Cancer Society"CA: A Cancer Journal for Clinicians: Oct 04, 2016   6) Routine healthcare maintenance including cholesterol, diabetes screening discussed managed by PCP  7) anxiety - discontinue citalopram 20mg , continue duloxetine 60mg .  Add buspar 10mg  po  8) HRT - good control on current dose, no changes  9) Return in about 2 weeks (around 01/30/2019).   Malachy Mood, MD, [facility], [facility] 01/16/2019, 4:00 PM

## 2019-01-17 LAB — HEP, RPR, HIV PANEL
HIV Screen 4th Generation wRfx: NONREACTIVE
Hepatitis B Surface Ag: NEGATIVE
RPR Ser Ql: NONREACTIVE

## 2019-01-22 LAB — PAPIG, CTNGTV, HPV, RFX 16/18
Chlamydia, Nuc. Acid Amp: NEGATIVE
Gonococcus, Nuc. Acid Amp: NEGATIVE
HPV, high-risk: NEGATIVE
PAP Smear Comment: 0
Trich vag by NAA: NEGATIVE

## 2019-01-30 ENCOUNTER — Ambulatory Visit (INDEPENDENT_AMBULATORY_CARE_PROVIDER_SITE_OTHER): Payer: 59 | Admitting: Obstetrics and Gynecology

## 2019-01-30 ENCOUNTER — Other Ambulatory Visit: Payer: Self-pay

## 2019-01-30 DIAGNOSIS — F332 Major depressive disorder, recurrent severe without psychotic features: Secondary | ICD-10-CM

## 2019-01-30 MED ORDER — VENLAFAXINE HCL ER 75 MG PO CP24: 75.0000 mg | ORAL_CAPSULE | Freq: Every day | ORAL | 3 refills | Status: DC

## 2019-01-30 NOTE — Progress Notes (Signed)
I connected with Patrici L Langhorst on 01/30/19 at  1:50 PM EDT by telephone and verified that I am speaking with the correct person using two identifiers.   I discussed the limitations, risks, security and privacy concerns of performing an evaluation and management service by telephone and the availability of in person appointments. I also discussed with the patient that there may be a patient responsible charge related to this service. The patient expressed understanding and agreed to proceed.  The patient was at home I spoke with the patient from my workstation phone The names of people involved in this encounter were: Arryanna L Christiana , and [name]   Obstetrics & Gynecology Office Visit   Chief Complaint:  Chief Complaint  Patient presents with  . Follow-up    Medication anxiety/depression    History of Present Illness: The patient is a 48 y.o. female presenting follow up for symptoms of anxiety and depression.  The patient is currently taking celexa 60mg  and buspar 10mg  po bid for the management of her symptoms.  She has not had any recent situational stressors.  She reports symptoms of anhedonia, day time somnolence, insomnia, irritability, social anxiety, agorophobia, feelings of guilt and feelings of worthlessness.  She denies risk taking behavior, suicidal ideation, homicidal ideation, auditory hallucinations and visual hallucinations. Symptoms have remained unchanged since last visit.     The patient does have a pre-existing history of depression and anxiety.  She  does not a prior history of suicide attempts.    Review of Systems:Review of Systems  Constitutional: Negative.   Gastrointestinal: Negative for nausea.  Neurological: Negative for headaches.  Psychiatric/Behavioral: Positive for depression. Negative for hallucinations, substance abuse and suicidal ideas. The patient is nervous/anxious and has insomnia.      Past Medical History:  Past Medical History:   Diagnosis Date  . Allergy   . Anxiety   . Asthma   . Depression   . Hashimoto's disease     Past Surgical History:  Past Surgical History:  Procedure Laterality Date  . ANKLE SURGERY     tendon repair with incison in L calf   . CESAREAN SECTION     2x  . OOPHORECTOMY Left 08/2015   removed due to torsion with benign mass    Gynecologic History: No LMP recorded. (Menstrual status: Oral contraceptives).  Obstetric History: G2P2  Family History:  Family History  Problem Relation Age of Onset  . Cancer Maternal Aunt 50       Colon  . Stroke Paternal Grandmother   . Cancer Paternal Grandmother        Brain and Kidney  . Heart disease Paternal Grandfather     Social History:  Social History   Socioeconomic History  . Marital status: Divorced    Spouse name: Not on file  . Number of children: Not on file  . Years of education: Not on file  . Highest education level: Not on file  Occupational History  . Not on file  Social Needs  . Financial resource strain: Not on file  . Food insecurity    Worry: Not on file    Inability: Not on file  . Transportation needs    Medical: Not on file    Non-medical: Not on file  Tobacco Use  . Smoking status: Never Smoker  . Smokeless tobacco: Never Used  Substance and Sexual Activity  . Alcohol use: No  . Drug use: No  . Sexual activity: Not Currently  Partners: Male    Birth control/protection: Injection  Lifestyle  . Physical activity    Days per week: Not on file    Minutes per session: Not on file  . Stress: Not on file  Relationships  . Social Musicianconnections    Talks on phone: Not on file    Gets together: Not on file    Attends religious service: Not on file    Active member of club or organization: Not on file    Attends meetings of clubs or organizations: Not on file    Relationship status: Not on file  . Intimate partner violence    Fear of current or ex partner: Not on file    Emotionally abused: Not on  file    Physically abused: Not on file    Forced sexual activity: Not on file  Other Topics Concern  . Not on file  Social History Narrative  . Not on file    Allergies:  No Known Allergies  Medications: Prior to Admission medications   Medication Sig Start Date End Date Taking? Authorizing Provider  Ascorbic Acid (VITAMIN C) 1000 MG tablet Take 1,000 mg by mouth daily.   Yes [provider]  b complex vitamins capsule Take 1 capsule by mouth daily.   Yes [provider]  busPIRone (BUSPAR) 10 MG tablet Take 1 tablet (10 mg total) by mouth 2 (two) times daily. 01/16/19  Yes Vena AustriaStaebler, Weldon Nouri, MD  cholecalciferol (VITAMIN D) 1000 units tablet Take 1,000 Units by mouth daily.   Yes [provider]  DULoxetine (CYMBALTA) 60 MG capsule Take by mouth.   Yes [provider]  estrogens, conjugated, (PREMARIN) 0.9 MG tablet Take 1 tablet (0.9 mg total) by mouth daily. 01/16/19  Yes Vena AustriaStaebler, Charlean Carneal, MD  fluticasone (FLONASE) 50 MCG/ACT nasal spray Place 2 sprays into both nostrils daily. 04/21/18  Yes Lutricia Feiloemer, William P, PA-C  LamoTRIgine (LAMICTAL PO) Take 60 mg by mouth.   Yes [provider]  Levothyroxine Sodium (TIROSINT) 200 MCG CAPS Take by mouth. 12/07/17  Yes [provider]  phentermine (ADIPEX-P) 37.5 MG tablet Take by mouth.   Yes [provider]  PROAIR HFA 108 [phone](90 Base) MCG/ACT inhaler  09/09/16  Yes [provider]  progesterone (PROMETRIUM) 100 MG capsule Take 1 capsule (100 mg total) by mouth daily. 01/16/19  Yes Vena AustriaStaebler, Zvi Duplantis, MD  Semaglutide 0.25 or 0.5 MG/DOSE SOPN Inject into the skin.   Yes [provider]  traZODone (DESYREL) 150 MG tablet Take by mouth at bedtime.   Yes [provider]  TROKENDI XR 50 MG CP24 TK ONE C PO D 12/15/17  Yes [provider]    Physical Exam Vitals: There were no vitals filed for this visit. No LMP recorded. (Menstrual status: Oral  contraceptives).  No physical exam as this was a remote telephone visit to promote social distancing during the current COVID-19 Pandemic  GAD 7 : Generalized Anxiety Score 01/30/2019  Nervous, Anxious, on Edge 1  Control/stop worrying 1  Worry too much - different things 1  Trouble relaxing 1  Restless 1  Easily annoyed or irritable 1  Afraid - awful might happen 1  Total GAD 7 Score 7  Anxiety Difficulty Somewhat difficult    Depression screen PHQ 2/9 01/30/2019  Decreased Interest 2  Down, Depressed, Hopeless 2  PHQ - 2 Score 4  Altered sleeping 3  Tired, decreased energy 3  Change in appetite 3  Feeling bad or failure  about yourself  2  Trouble concentrating 2  Moving slowly or fidgety/restless 2  Suicidal thoughts 0  PHQ-9 Score 19  Difficult doing work/chores Very difficult    Depression screen PHQ 2/9 01/30/2019  Decreased Interest 2  Down, Depressed, Hopeless 2  PHQ - 2 Score 4  Altered sleeping 3  Tired, decreased energy 3  Change in appetite 3  Feeling bad or failure about yourself  2  Trouble concentrating 2  Moving slowly or fidgety/restless 2  Suicidal thoughts 0  PHQ-9 Score 19  Difficult doing work/chores Very difficult     Assessment: 48 y.o. G2P2 follow up anxiety depression  Plan: Problem List Items Addressed This Visit    None    Visit Diagnoses    Severe episode of recurrent major depressive disorder, without psychotic features ([place])    -  Primary   Relevant Medications   venlafaxine XR (EFFEXOR-XR) 75 MG 24 hr capsule      1) Discontinue Cymbalta 60mg , switch to Effexor XR 75mg , continue buspar 10mg  bid  2) Thyroid and B12 screen has been obtained previously  3) Time 12:43 minutes  4) Return in about 2 weeks (around 02/13/2019) for medication follow up phone or in office.  Malachy Mood, MD, [facility], [facility] 01/30/2019, 2:18 PM

## 2019-02-13 ENCOUNTER — Ambulatory Visit (INDEPENDENT_AMBULATORY_CARE_PROVIDER_SITE_OTHER): Payer: 59 | Admitting: Obstetrics and Gynecology

## 2019-02-13 ENCOUNTER — Other Ambulatory Visit: Payer: Self-pay

## 2019-02-13 DIAGNOSIS — F3341 Major depressive disorder, recurrent, in partial remission: Secondary | ICD-10-CM | POA: Diagnosis not present

## 2019-02-13 NOTE — Progress Notes (Signed)
I connected with Molly Bright on 02/17/19 at  1:50 PM EDT by telephone and verified that I am speaking with the correct person using two identifiers.   I discussed the limitations, risks, security and privacy concerns of performing an evaluation and management service by telephone and the availability of in person appointments. I also discussed with the patient that there may be a patient responsible charge related to this service. The patient expressed understanding and agreed to proceed.  The patient was at home I spoke with the patient from my workstation phone The names of people involved in this encounter were: Molly Bright , and Vena AustriaAndreas Forestine Macho   Obstetrics & Gynecology Office Visit   Chief Complaint: No chief complaint on file.   History of Present Illness: The patient is a 48 y.o. female presenting follow up for symptoms of anxiety and depression.  The patient is currently taking Effexor XR 75mg  and buspirone 10mg  po bid for the management of her symptoms.  She has not had any recent situational stressors.  She reports resolution of  symptoms switching to Effexor.  She denies anhedonia, day time somnolence, insomnia, risk taking behavior, irritability, social anxiety, agorophobia, feelings of guilt, feelings of worthlessness, suicidal ideation, homicidal ideation, auditory hallucinations and visual hallucinations. Symptoms have improved since last visit.     The patient does have a pre-existing history of depression and anxiety.  She  does not a prior history of suicide attempts.    Review of Systems: Review of Systems  Constitutional: Negative.   Gastrointestinal: Negative for nausea and vomiting.  Neurological: Negative for headaches.  Psychiatric/Behavioral: Negative.      Past Medical History:  Past Medical History:  Diagnosis Date  . Allergy   . Anxiety   . Asthma   . Depression   . Hashimoto's disease     Past Surgical History:  Past Surgical History:   Procedure Laterality Date  . ANKLE SURGERY     tendon repair with incison in L calf   . CESAREAN SECTION     2x  . OOPHORECTOMY Left 08/2015   removed due to torsion with benign mass    Gynecologic History: No LMP recorded. (Menstrual status: Oral contraceptives).  Obstetric History: G2P2  Family History:  Family History  Problem Relation Age of Onset  . Cancer Maternal Aunt 50       Colon  . Stroke Paternal Grandmother   . Cancer Paternal Grandmother        Brain and Kidney  . Heart disease Paternal Grandfather     Social History:  Social History   Socioeconomic History  . Marital status: Divorced    Spouse name: Not on file  . Number of children: Not on file  . Years of education: Not on file  . Highest education level: Not on file  Occupational History  . Not on file  Social Needs  . Financial resource strain: Not on file  . Food insecurity    Worry: Not on file    Inability: Not on file  . Transportation needs    Medical: Not on file    Non-medical: Not on file  Tobacco Use  . Smoking status: Never Smoker  . Smokeless tobacco: Never Used  Substance and Sexual Activity  . Alcohol use: No  . Drug use: No  . Sexual activity: Not Currently    Partners: Male    Birth control/protection: Injection  Lifestyle  . Physical activity    Days  per week: Not on file    Minutes per session: Not on file  . Stress: Not on file  Relationships  . Social Musician on phone: Not on file    Gets together: Not on file    Attends religious service: Not on file    Active member of club or organization: Not on file    Attends meetings of clubs or organizations: Not on file    Relationship status: Not on file  . Intimate partner violence    Fear of current or ex partner: Not on file    Emotionally abused: Not on file    Physically abused: Not on file    Forced sexual activity: Not on file  Other Topics Concern  . Not on file  Social History Narrative  .  Not on file    Allergies:  No Known Allergies  Medications: Prior to Admission medications   Medication Sig Start Date End Date Taking? Authorizing Provider  Ascorbic Acid (VITAMIN C) 1000 MG tablet Take 1,000 mg by mouth daily.    [provider]  b complex vitamins capsule Take 1 capsule by mouth daily.    [provider]  busPIRone (BUSPAR) 10 MG tablet Take 1 tablet (10 mg total) by mouth 2 (two) times daily. 01/16/19   [name], MD  cholecalciferol (VITAMIN D) 1000 units tablet Take 1,000 Units by mouth daily.    [provider]  estrogens, conjugated, (PREMARIN) 0.9 MG tablet Take 1 tablet (0.9 mg total) by mouth daily. 01/16/19   [name], MD  fluticasone (FLONASE) 50 MCG/ACT nasal spray Place 2 sprays into both nostrils daily. 04/21/18   Lutricia Feil, PA-C  LamoTRIgine (LAMICTAL PO) Take 60 mg by mouth.    [provider]  Levothyroxine Sodium (TIROSINT) 200 MCG CAPS Take by mouth. 12/07/17   [provider]  phentermine (ADIPEX-P) 37.5 MG tablet Take by mouth.    [provider]  PROAIR HFA 108 [phone] Base) MCG/ACT inhaler  09/09/16   [provider]  progesterone (PROMETRIUM) 100 MG capsule Take 1 capsule (100 mg total) by mouth daily. 01/16/19   [name], MD  Semaglutide 0.25 or 0.5 MG/DOSE SOPN Inject into the skin.    [provider]  traZODone (DESYREL) 150 MG tablet Take by mouth at bedtime.    [provider]  TROKENDI XR 50 MG CP24 TK ONE C PO D 12/15/17   [provider]  venlafaxine XR (EFFEXOR-XR) 75 MG 24 hr capsule Take 1 capsule (75 mg total) by mouth daily. 01/30/19   [name], MD    Physical Exam Vitals: There were no vitals filed for this visit. No LMP recorded. (Menstrual status: Oral contraceptives).  No physical exam as this was a remote telephone visit to promote social distancing during the current COVID-19 Pandemic   GAD 7 :  Generalized Anxiety Score 02/13/2019 01/30/2019  Nervous, Anxious, on Edge 1 1  Control/stop worrying 1 1  Worry too much - different things 1 1  Trouble relaxing 0 1  Restless 0 1  Easily annoyed or irritable 1 1  Afraid - awful might happen 1 1  Total GAD 7 Score 5 7  Anxiety Difficulty Somewhat difficult Somewhat difficult    Depression screen [facility] 2/9 02/13/2019 01/30/2019  Decreased Interest 1 2  Down, Depressed, Hopeless 1 2  PHQ - 2 Score 2 4  Altered sleeping 2 3  Tired, decreased energy 2 3  Change in appetite 0 3  Feeling bad or failure about yourself  1 2  Trouble concentrating 1 2  Moving slowly or fidgety/restless 0 2  Suicidal thoughts 0 0  PHQ-9 Score 8 19  Difficult doing work/chores Not difficult at all Very difficult    Depression screen [facility] 2/9 02/13/2019 01/30/2019  Decreased Interest 1 2  Down, Depressed, Hopeless 1 2  PHQ - 2 Score 2 4  Altered sleeping 2 3  Tired, decreased energy 2 3  Change in appetite 0 3  Feeling bad or failure about yourself  1 2  Trouble concentrating 1 2  Moving slowly or fidgety/restless 0 2  Suicidal thoughts 0 0  PHQ-9 Score 8 19  Difficult doing work/chores Not difficult at all Very difficult     Assessment: 48 y.o. G2P2 depression follow up  Plan: Problem List Items Addressed This Visit    None    Visit Diagnoses    Recurrent major depressive disorder, in partial remission ([place])    -  Primary      1) Depression - good response to switch in Effexor.  Only side-effects noted are poor appetite and headaches although she attributes her headaches to her osteoarthritis.  Sleep still remains a cental symptom, able to fall asleep with trazedone but not rested in AM, if half dose of trazedone awakens around 3AM and has trouble falling back asleep but easier getting up in the morning  2) Thyroid and B12 screen has been obtained previously  3) Telephone time 9:00 min  4) Return in about 4 weeks (around 03/13/2019) for  medication follow up phone.    Malachy Mood, MD, [facility], [facility] 02/13/2019, 2:00 PM

## 2019-03-13 ENCOUNTER — Other Ambulatory Visit: Payer: Self-pay

## 2019-03-13 ENCOUNTER — Ambulatory Visit (INDEPENDENT_AMBULATORY_CARE_PROVIDER_SITE_OTHER): Payer: 59 | Admitting: Obstetrics and Gynecology

## 2019-03-13 DIAGNOSIS — F419 Anxiety disorder, unspecified: Secondary | ICD-10-CM | POA: Diagnosis not present

## 2019-03-13 DIAGNOSIS — F329 Major depressive disorder, single episode, unspecified: Secondary | ICD-10-CM

## 2019-03-13 MED ORDER — VENLAFAXINE HCL ER 75 MG PO CP24: 75.0000 mg | ORAL_CAPSULE | Freq: Every day | ORAL | 3 refills | Status: DC

## 2019-03-13 NOTE — Progress Notes (Signed)
I connected with Molly Bright on 03/18/19 at  1:50 PM EST by telephone and verified that I am speaking with the correct person using two identifiers.   I discussed the limitations, risks, security and privacy concerns of performing an evaluation and management service by telephone and the availability of in person appointments. I also discussed with the patient that there may be a patient responsible charge related to this service. The patient expressed understanding and agreed to proceed.  The patient was at home I spoke with the patient from my workstation phone The names of people involved in this encounter were: Molly Bright , and [facility] Visit   Chief Complaint:  Chief Complaint  Patient presents with  . Follow-up    depression    History of Present Illness: The patient is a 48 y.o. female presenting follow up for symptoms of anxiety and depression.  The patient is currently taking Effexor XR 75mg  for the management of her symptoms and buspar 10mg  po bid.  She has not had any recent situational stressors.  She reports resolution of symptoms on current pharmacotherapy.  She denies anhedonia, day time somnolence, insomnia, risk taking behavior, irritability, social anxiety, agorophobia, feelings of guilt, feelings of worthlessness, suicidal ideation, homicidal ideation, auditory hallucinations and visual hallucinations. Symptoms have improved since last visit.     The patient does have a pre-existing history of depression and anxiety.  She  does not a prior history of suicide attempts.    Review of Systems: Review of Systems  Constitutional: Negative.   Gastrointestinal: Negative for nausea and vomiting.  Neurological: Negative for headaches.  Psychiatric/Behavioral: Negative.    Past Medical History:  Past Medical History:  Diagnosis Date  . Allergy   . Anxiety   . Asthma   . Depression   . Hashimoto's disease     Past  Surgical History:  Past Surgical History:  Procedure Laterality Date  . ANKLE SURGERY     tendon repair with incison in L calf   . CESAREAN SECTION     2x  . OOPHORECTOMY Left 08/2015   removed due to torsion with benign mass    Gynecologic History: No LMP recorded. (Menstrual status: Oral contraceptives).  Obstetric History: G2P2  Family History:  Family History  Problem Relation Age of Onset  . Cancer Maternal Aunt 50       Colon  . Stroke Paternal Grandmother   . Cancer Paternal Grandmother        Brain and Kidney  . Heart disease Paternal Grandfather     Social History:  Social History   Socioeconomic History  . Marital status: Divorced    Spouse name: Not on file  . Number of children: Not on file  . Years of education: Not on file  . Highest education level: Not on file  Occupational History  . Not on file  Social Needs  . Financial resource strain: Not on file  . Food insecurity    Worry: Not on file    Inability: Not on file  . Transportation needs    Medical: Not on file    Non-medical: Not on file  Tobacco Use  . Smoking status: Never Smoker  . Smokeless tobacco: Never Used  Substance and Sexual Activity  . Alcohol use: No  . Drug use: No  . Sexual activity: Not Currently    Partners: Male    Birth control/protection: Injection  Lifestyle  .  Physical activity    Days per week: Not on file    Minutes per session: Not on file  . Stress: Not on file  Relationships  . Social Musician on phone: Not on file    Gets together: Not on file    Attends religious service: Not on file    Active member of club or organization: Not on file    Attends meetings of clubs or organizations: Not on file    Relationship status: Not on file  . Intimate partner violence    Fear of current or ex partner: Not on file    Emotionally abused: Not on file    Physically abused: Not on file    Forced sexual activity: Not on file  Other Topics Concern  .  Not on file  Social History Narrative  . Not on file    Allergies:  No Known Allergies  Medications: Prior to Admission medications   Medication Sig Start Date End Date Taking? Authorizing Provider  Ascorbic Acid (VITAMIN C) 1000 MG tablet Take 1,000 mg by mouth daily.   Yes [provider]  b complex vitamins capsule Take 1 capsule by mouth daily.   Yes [provider]  busPIRone (BUSPAR) 10 MG tablet Take 1 tablet (10 mg total) by mouth 2 (two) times daily. 01/16/19  Yes [name], MD  cholecalciferol (VITAMIN D) 1000 units tablet Take 1,000 Units by mouth daily.   Yes [provider]  estrogens, conjugated, (PREMARIN) 0.9 MG tablet Take 1 tablet (0.9 mg total) by mouth daily. 01/16/19  Yes [name], MD  fluticasone (FLONASE) 50 MCG/ACT nasal spray Place 2 sprays into both nostrils daily. 04/21/18  Yes Lutricia Feil, PA-C  LamoTRIgine (LAMICTAL PO) Take 60 mg by mouth.   Yes [provider]  Levothyroxine Sodium (TIROSINT) 200 MCG CAPS Take by mouth. 12/07/17  Yes [provider]  phentermine (ADIPEX-P) 37.5 MG tablet Take by mouth.   Yes [provider]  PROAIR HFA 108 [phone] Base) MCG/ACT inhaler  09/09/16  Yes [provider]  progesterone (PROMETRIUM) 100 MG capsule Take 1 capsule (100 mg total) by mouth daily. 01/16/19  Yes [name], MD  Semaglutide 0.25 or 0.5 MG/DOSE SOPN Inject into the skin.   Yes [provider]  traZODone (DESYREL) 150 MG tablet Take by mouth at bedtime.   Yes [provider]  TROKENDI XR 50 MG CP24 TK ONE C PO D 12/15/17  Yes [provider]  venlafaxine XR (EFFEXOR-XR) 75 MG 24 hr capsule Take 1 capsule (75 mg total) by mouth daily. 01/30/19  Yes [name], MD    Physical Exam Vitals: There were no vitals filed for this visit. No LMP recorded. (Menstrual status: Oral contraceptives).  No physical exam as this was a remote telephone visit  to promote social distancing during the current COVID-19 Pandemic   GAD 7 : Generalized Anxiety Score 03/13/2019 02/13/2019 01/30/2019  Nervous, Anxious, on Edge 1 1 1   Control/stop worrying 0 1 1  Worry too much - different things 0 1 1  Trouble relaxing 0 0 1  Restless 0 0 1  Easily annoyed or irritable 0 1 1  Afraid - awful might happen 0 1 1  Total GAD 7 Score 1 5 7   Anxiety Difficulty Not difficult at all Somewhat difficult Somewhat difficult    Depression screen [facility] 2/9 03/13/2019 02/13/2019 01/30/2019  Decreased Interest 0 1 2  Down, Depressed, Hopeless  0 1 2  PHQ - 2 Score 0 2 4  Altered sleeping 1 2 3   Tired, decreased energy 1 2 3   Change in appetite 0 0 3  Feeling bad or failure about yourself  0 1 2  Trouble concentrating 0 1 2  Moving slowly or fidgety/restless 0 0 2  Suicidal thoughts 0 0 0  PHQ-9 Score 2 8 19   Difficult doing work/chores Not difficult at all Not difficult at all Very difficult    Dep[facility] screen [facility] 2/9 03/13/2019 02/13/2019 01/30/2019  Decreased Interest 0 1 2  Down, Depressed, Hopeless 0 1 2  PHQ - 2 Score 0 2 4  Altered sleeping 1 2 3   Tired, decreased energy 1 2 3   Change in appetite 0 0 3  Feeling bad or failure about yourself  0 1 2  Trouble concentrating 0 1 2  Moving slowly or fidgety/restless 0 0 2  Suicidal thoughts 0 0 0  PHQ-9 Score 2 8 19   Difficult doing work/chores Not difficult at all Not difficult at all Very difficult     Assessment: 48 y.o. G2P2 follow up anxiety/depression  Plan: Problem List Items Addressed This Visit    None    Visit Diagnoses    Anxiety and depression    -  Primary   Relevant Medications   venlafaxine XR (EFFEXOR-XR) 75 MG 24 hr capsule      1) Good response to current dose of effexor continue at 75mg .  Headaches have subsided.  Is still having sleep disturbances not relieved by trazedone.  Trouble initially getting up in the morning but no issues with energy fatigue throughout the day  2)  Thyroid and B12 screen has been obtained previously  3) Telephone time 5:3533minutes  4) Return in about 2 months (around 05/13/2019) for Sometime after christmas phone visit.  Vena AustriaAndreas Brittan Mapel, MD, Evern CoreFACOG [facility], [facility] 03/13/2019, 2:10 PM

## 2019-03-24 ENCOUNTER — Other Ambulatory Visit: Payer: Self-pay | Admitting: Obstetrics and Gynecology

## 2019-03-24 MED ORDER — TRAZODONE HCL 150 MG PO TABS: 150.0000 mg | ORAL_TABLET | Freq: Every day | ORAL | 3 refills | Status: DC

## 2019-04-19 ENCOUNTER — Other Ambulatory Visit: Payer: Self-pay | Admitting: Obstetrics and Gynecology

## 2019-05-14 ENCOUNTER — Encounter: Payer: Self-pay | Admitting: Obstetrics and Gynecology

## 2019-05-14 ENCOUNTER — Other Ambulatory Visit: Payer: Self-pay

## 2019-05-14 ENCOUNTER — Ambulatory Visit (INDEPENDENT_AMBULATORY_CARE_PROVIDER_SITE_OTHER): Payer: 59 | Admitting: Obstetrics and Gynecology

## 2019-05-14 VITALS — Ht 64.0 in | Wt 212.0 lb

## 2019-05-14 DIAGNOSIS — F329 Major depressive disorder, single episode, unspecified: Secondary | ICD-10-CM | POA: Diagnosis not present

## 2019-05-14 DIAGNOSIS — F419 Anxiety disorder, unspecified: Secondary | ICD-10-CM | POA: Diagnosis not present

## 2019-05-14 NOTE — Progress Notes (Signed)
I connected with Molly Bright on 05/14/19 at  1:30 PM EST by telephone and verified that I am speaking with the correct person using two identifiers.   I discussed the limitations, risks, security and privacy concerns of performing an evaluation and management service by telephone and the availability of in person appointments. I also discussed with the patient that there may be a patient responsible charge related to this service. The patient expressed understanding and agreed to proceed.  The patient was at home I spoke with the patient from my workstation phone The names of people involved in this encounter were: Molly Bright , and Molly Bright   Chief Complaint:  Chief Complaint  Patient presents with  . Follow-up    depression    History of Present Illness: The patient is a 49 y.o. female presenting follow up for symptoms of anxiety and depression.  The patient is currently taking Effexor XR 75mg  po daily and Buspar 10mg  po bid for the management of her symptoms.  She has had  recent situational stressors, including her sister and nephew moving in with her, work, holidays.  She continues to report good improvement in symptoms.  She denies anhedonia, day time somnolence, insomnia, risk taking behavior, irritability, social anxiety, agorophobia, feelings of guilt, feelings of worthlessness, suicidal ideation, homicidal ideation, auditory hallucinations and visual hallucinations. Symptoms have improved since last Bright.     The patient does have a pre-existing history of depression and anxiety.  She  does not a prior history of suicide attempts.    Review of Systems: Review of systems negative unless noted in hpi  Past Medical History:  Past Medical History:  Diagnosis Date  . Allergy   . Anxiety   . Asthma   . Depression   . Hashimoto's disease     Past Surgical History:  Past Surgical History:  Procedure Laterality Date   . ANKLE SURGERY     tendon repair with incison in L calf   . CESAREAN SECTION     2x  . OOPHORECTOMY Left 08/2015   removed due to torsion with benign mass    Gynecologic History: No LMP recorded. (Menstrual status: Oral contraceptives).  Obstetric History: G2P2  Family History:  Family History  Problem Relation Age of Onset  . Cancer Maternal Aunt 50       Colon  . Stroke Paternal Grandmother   . Cancer Paternal Grandmother        Brain and Kidney  . Heart disease Paternal Grandfather     Social History:  Social History   Socioeconomic History  . Marital status: Divorced    Spouse name: Not on file  . Number of children: Not on file  . Years of education: Not on file  . Highest education level: Not on file  Occupational History  . Not on file  Tobacco Use  . Smoking status: Never Smoker  . Smokeless tobacco: Never Used  Substance and Sexual Activity  . Alcohol use: No  . Drug use: No  . Sexual activity: Not Currently    Partners: Male    Birth control/protection: Injection  Other Topics Concern  . Not on file  Social History Narrative  . Not on file   Social Determinants of Health   Financial Resource Strain:   . Difficulty of Paying Living Expenses: Not on file  Food Insecurity:   . Worried About Charity fundraiser in the  Last Year: Not on file  . Ran Out of Food in the Last Year: Not on file  Transportation Needs:   . Lack of Transportation (Medical): Not on file  . Lack of Transportation (Non-Medical): Not on file  Physical Activity:   . Days of Exercise per Week: Not on file  . Minutes of Exercise per Session: Not on file  Stress:   . Feeling of Stress : Not on file  Social Connections:   . Frequency of Communication with Friends and Family: Not on file  . Frequency of Social Gatherings with Friends and Family: Not on file  . Attends Religious Services: Not on file  . Active Member of Clubs or Organizations: Not on file  . Attends Tax inspector Meetings: Not on file  . Marital Status: Not on file  Intimate Partner Violence:   . Fear of Current or Ex-Partner: Not on file  . Emotionally Abused: Not on file  . Physically Abused: Not on file  . Sexually Abused: Not on file    Allergies:  No Known Allergies  Medications: Prior to Admission medications   Medication Sig Start Date End Date Taking? Authorizing Provider  Ascorbic Acid (VITAMIN C) 1000 MG tablet Take 1,000 mg by mouth daily.   Yes [provider]  b complex vitamins capsule Take 1 capsule by mouth daily.   Yes [provider]  busPIRone (BUSPAR) 10 MG tablet TAKE 1 TABLET(10 MG) BY MOUTH TWICE DAILY 04/20/19  Yes [name], MD  cholecalciferol (VITAMIN D) 1000 units tablet Take 1,000 Units by mouth daily.   Yes [provider]  estrogens, conjugated, (PREMARIN) 0.9 MG tablet Take 1 tablet (0.9 mg total) by mouth daily. 01/16/19  Yes [name], MD  fluticasone (FLONASE) 50 MCG/ACT nasal spray Place 2 sprays into both nostrils daily. 04/21/18  Yes Lutricia Feil, PA-C  phentermine (ADIPEX-P) 37.5 MG tablet Take 37.5 mg by mouth every morning. 04/15/19  Yes [provider]  PROAIR HFA 108 [phone] Base) MCG/ACT inhaler  09/09/16  Yes [provider]  progesterone (PROMETRIUM) 100 MG capsule Take 1 capsule (100 mg total) by mouth daily. 01/16/19  Yes [name], MD  Semaglutide 0.25 or 0.5 MG/DOSE SOPN Inject into the skin.   Yes [provider]  TIROSINT 125 MCG CAPS TAKE 1 CAPSULE BY MOUTH DAILY EXCEPT TAKE 2 CAPSULES ON SATURDAYS 04/14/19  Yes [provider]  traZODone (DESYREL) 150 MG tablet Take 1 tablet (150 mg total) by mouth at bedtime. 03/24/19  Yes [name], MD  TROKENDI XR 100 MG CP24 Take 1 capsule by mouth daily. 03/24/19  Yes [provider]  venlafaxine XR (EFFEXOR-XR) 75 MG 24 hr capsule Take 1 capsule (75 mg total) by mouth daily. 03/13/19  Yes  [name], MD    Physical Exam Vitals: There were no vitals filed for this Bright. No LMP recorded. (Menstrual status: Oral contraceptives).  No physical exam as this was a remote telephone Bright to promote social distancing during the current COVID-19 Pandemic  GAD 7 : Generalized Anxiety Score 03/13/2019 02/13/2019 01/30/2019  Nervous, Anxious, on Edge 1 1 1   Control/stop worrying 0 1 1  Worry too much - different things 0 1 1  Trouble relaxing 0 0 1  Restless 0 0 1  Easily annoyed or irritable 0 1 1  Afraid - awful might happen 0 1 1  Total GAD 7 Score 1 5 7   Anxiety Difficulty Not difficult at all  Somewhat difficult Somewhat difficult    Depression screen [facility] 2/9 05/14/2019 03/13/2019 02/13/2019  Decreased Interest 0 0 1  Down, Depressed, Hopeless 0 0 1  PHQ - 2 Score 0 0 2  Altered sleeping 0 1 2  Tired, decreased energy 1 1 2   Change in appetite 0 0 0  Feeling bad or failure about yourself  1 0 1  Trouble concentrating 0 0 1  Moving slowly or fidgety/restless 0 0 0  Suicidal thoughts 0 0 0  PHQ-9 Score 2 2 8   Difficult doing work/chores Not difficult at all Not difficult at all Not difficult at all    Depression screen [facility] 2/9 05/14/2019 03/13/2019 02/13/2019 01/30/2019  Decreased Interest 0 0 1 2  Down, Depressed, Hopeless 0 0 1 2  PHQ - 2 Score 0 0 2 4  Altered sleeping 0 1 2 3   Tired, decreased energy 1 1 2 3   Change in appetite 0 0 0 3  Feeling bad or failure about yourself  1 0 1 2  Trouble concentrating 0 0 1 2  Moving slowly or fidgety/restless 0 0 0 2  Suicidal thoughts 0 0 0 0  PHQ-9 Score 2 2 8 19   Difficult doing work/chores Not difficult at all Not difficult at all Not difficult at all Very difficult     Assessment: 50 y.o. G2P2 follow up anxiety and depression  Plan: Problem List Items Addressed This Bright    None    Bright Diagnoses    Anxiety and depression    -  Primary      1) Anxiety/depression - continues to have good response to  Effexor XR 75mg  daily even through holiday's.  Continue at present dose as symptoms remain in remission  2) Thyroid and B12 screen has been obtained previously  3) Telephone time 6:00 minutes  4) Return in about 8 months (around 01/12/2020) for annual.   , MD, OB/GYN, [facility] 05/14/2019, 1:28 PM

## 2019-05-23 ENCOUNTER — Telehealth: Payer: Self-pay

## 2019-05-23 NOTE — Telephone Encounter (Signed)
Rx are being transferred to [facility]. Forms filled out to get pt to her annual exam in 01/2020

## 2019-06-24 NOTE — Telephone Encounter (Signed)
Failed insertion, pt started OCP's.

## 2019-09-19 ENCOUNTER — Other Ambulatory Visit: Payer: Self-pay | Admitting: Obstetrics and Gynecology

## 2019-09-19 NOTE — Telephone Encounter (Signed)
Last appointment was 1/21. Please advise

## 2020-01-07 ENCOUNTER — Other Ambulatory Visit: Payer: Self-pay | Admitting: Obstetrics and Gynecology

## 2020-01-08 MED ORDER — PROGESTERONE MICRONIZED 100 MG PO CAPS: 100.0000 mg | ORAL_CAPSULE | Freq: Every day | ORAL | 3 refills | Status: DC

## 2020-01-08 NOTE — Telephone Encounter (Signed)
Please advise 

## 2020-01-28 ENCOUNTER — Other Ambulatory Visit: Payer: Self-pay | Admitting: Obstetrics and Gynecology

## 2020-05-13 ENCOUNTER — Other Ambulatory Visit: Payer: Self-pay | Admitting: Obstetrics and Gynecology

## 2020-07-16 ENCOUNTER — Telehealth: Payer: Self-pay

## 2020-07-16 ENCOUNTER — Other Ambulatory Visit: Payer: Self-pay | Admitting: Obstetrics and Gynecology

## 2020-07-16 MED ORDER — ESTROGENS CONJUGATED 0.9 MG PO TABS
ORAL_TABLET | ORAL | 0 refills | Status: DC
Start: 2020-07-16 — End: 2020-11-17

## 2020-07-16 MED ORDER — BUSPIRONE HCL 10 MG PO TABS
10.0000 mg | ORAL_TABLET | Freq: Two times a day (BID) | ORAL | 0 refills | Status: DC
Start: 2020-07-16 — End: 2020-10-18

## 2020-07-16 NOTE — Telephone Encounter (Signed)
Pt calling for refill of Buspirone 10mg  and Premarin 0.9mg  to get her to her appointment on 08/06/20; [facility] [place].  [phone]

## 2020-07-16 NOTE — Telephone Encounter (Signed)
sent 

## 2020-07-19 NOTE — Telephone Encounter (Signed)
Pt aware.

## 2020-08-06 ENCOUNTER — Ambulatory Visit: Payer: No Typology Code available for payment source | Admitting: Obstetrics and Gynecology

## 2020-08-06 ENCOUNTER — Other Ambulatory Visit: Payer: Self-pay

## 2020-10-14 ENCOUNTER — Ambulatory Visit: Payer: No Typology Code available for payment source | Admitting: Obstetrics and Gynecology

## 2020-10-17 ENCOUNTER — Other Ambulatory Visit: Payer: Self-pay | Admitting: Obstetrics and Gynecology

## 2020-10-20 ENCOUNTER — Ambulatory Visit: Payer: No Typology Code available for payment source | Admitting: Obstetrics and Gynecology

## 2020-11-17 ENCOUNTER — Other Ambulatory Visit (HOSPITAL_COMMUNITY)
Admission: RE | Admit: 2020-11-17 | Discharge: 2020-11-17 | Disposition: A | Discharge status: Home / Self Care | Payer: Federal, State, Local not specified - PPO | Source: Ambulatory Visit | Attending: Obstetrics and Gynecology | Admitting: Obstetrics and Gynecology

## 2020-11-17 ENCOUNTER — Ambulatory Visit (INDEPENDENT_AMBULATORY_CARE_PROVIDER_SITE_OTHER): Payer: Federal, State, Local not specified - PPO | Admitting: Obstetrics and Gynecology

## 2020-11-17 ENCOUNTER — Other Ambulatory Visit: Payer: Self-pay

## 2020-11-17 ENCOUNTER — Encounter: Payer: Self-pay | Admitting: Obstetrics and Gynecology

## 2020-11-17 VITALS — BP 129/80 | HR 69 | Ht 64.0 in | Wt 144.0 lb

## 2020-11-17 DIAGNOSIS — Z1239 Encounter for other screening for malignant neoplasm of breast: Secondary | ICD-10-CM

## 2020-11-17 DIAGNOSIS — Z01419 Encounter for gynecological examination (general) (routine) without abnormal findings: Secondary | ICD-10-CM

## 2020-11-17 DIAGNOSIS — F411 Generalized anxiety disorder: Secondary | ICD-10-CM | POA: Diagnosis not present

## 2020-11-17 DIAGNOSIS — Z124 Encounter for screening for malignant neoplasm of cervix: Secondary | ICD-10-CM | POA: Diagnosis not present

## 2020-11-17 DIAGNOSIS — Z79890 Hormone replacement therapy (postmenopausal): Secondary | ICD-10-CM

## 2020-11-17 MED ORDER — ESTROGENS CONJUGATED 0.9 MG PO TABS: ORAL_TABLET | ORAL | 0 refills | Status: AC

## 2020-11-17 MED ORDER — PROGESTERONE MICRONIZED 100 MG PO CAPS
100.0000 mg | ORAL_CAPSULE | Freq: Every day | ORAL | 3 refills | Status: DC
Start: 2020-11-17 — End: 2021-07-20

## 2020-11-17 MED ORDER — VENLAFAXINE HCL ER 150 MG PO CP24: 150.0000 mg | ORAL_CAPSULE | Freq: Every day | ORAL | 2 refills | Status: DC

## 2020-11-17 MED ORDER — BUSPIRONE HCL 10 MG PO TABS
10.0000 mg | ORAL_TABLET | Freq: Two times a day (BID) | ORAL | 3 refills | Status: DC
Start: 2020-11-17 — End: 2020-12-22

## 2020-11-17 NOTE — Progress Notes (Signed)
Gynecology Annual Exam  PCP: Yvette Rack, MD  Chief Complaint:  Chief Complaint  Patient presents with   Gynecologic Exam    Annual - discuss medication for hormones, mood. RM 3    History of Present Illness:Patient is a 50 y.o. G2P2 presents for annual exam. The patient has no complaints today.   LMP: No LMP recorded. (Menstrual status: Oral contraceptives). No PMB reported   There is no notable family history of breast or ovarian cancer in her family.  The patient wears seatbelts: yes.   The patient has regular exercise: not asked. Has achieved significant weight loss in the last year.    The patient reports current symptoms of depression.   Had some situational stressor loosing employment now working at [employer].  Review of Systems: Review of Systems  Constitutional:  Negative for chills and fever.  HENT:  Negative for congestion.   Respiratory:  Negative for cough and shortness of breath.   Cardiovascular:  Negative for chest pain and palpitations.  Gastrointestinal:  Negative for abdominal pain, constipation, diarrhea, heartburn, nausea and vomiting.  Genitourinary:  Negative for dysuria, frequency and urgency.  Skin:  Negative for itching and rash.  Neurological:  Negative for dizziness and headaches.  Endo/Heme/Allergies:  Negative for polydipsia.  Psychiatric/Behavioral:  Positive for depression. Negative for hallucinations, substance abuse and suicidal ideas. The patient is nervous/anxious.    Past Medical History:  There are no problems to display for this patient.   Past Surgical History:  Past Surgical History:  Procedure Laterality Date   ANKLE SURGERY     tendon repair with incison in L calf    CESAREAN SECTION     2x   OOPHORECTOMY Left 08/2015   removed due to torsion with benign mass    Gynecologic History:  No LMP recorded. (Menstrual status: Oral contraceptives). Last Pap: Results were: 9/10/2020NIL and HR HPV negative  Last  mammogram: Being done at [facility]  Obstetric History: G2P2  Family History:  Family History  Problem Relation Age of Onset   Cancer Maternal Aunt 57       Colon   Stroke Paternal Grandmother    Cancer Paternal Grandmother        Brain and Kidney   Heart disease Paternal Grandfather     Social History:  Social History   Socioeconomic History   Marital status: Divorced    Spouse name: Not on file   Number of children: Not on file   Years of education: Not on file   Highest education level: Not on file  Occupational History   Not on file  Tobacco Use   Smoking status: Former    Pack years: 0.00    Types: Cigarettes   Smokeless tobacco: Never  Vaping Use   Vaping Use: Former   Substances: Flavoring  Substance and Sexual Activity   Alcohol use: No   Drug use: No   Sexual activity: Not Currently    Partners: Male    Birth control/protection: None  Other Topics Concern   Not on file  Social History Narrative   Not on file   Social Determinants of Health   Financial Resource Strain: Not on file  Food Insecurity: Not on file  Transportation Needs: Not on file  Physical Activity: Not on file  Stress: Not on file  Social Connections: Not on file  Intimate Partner Violence: Not on file    Allergies:  Allergies  Allergen Reactions   Latex Rash  Medications: Prior to Admission medications   Medication Sig Start Date End Date Taking? Authorizing Provider  Ascorbic Acid (VITAMIN C) 1000 MG tablet Take 1,000 mg by mouth daily.   Yes [provider]  b complex vitamins capsule Take 1 capsule by mouth daily.   Yes [provider]  busPIRone (BUSPAR) 10 MG tablet TAKE 1 TABLET BY MOUTH TWICE DAILY 10/18/20  Yes [name], MD  cholecalciferol (VITAMIN D) 1000 units tablet Take 1,000 Units by mouth daily.   Yes [provider]  estrogens, conjugated, (PREMARIN) 0.9 MG tablet TAKE 1 TABLET(0.9 MG) BY MOUTH DAILY 07/16/20  Yes [name], MD  fluticasone (FLONASE) 50 MCG/ACT nasal spray Place 2 sprays into both nostrils daily. 04/21/18  Yes Lutricia Feil, PA-C  phentermine (ADIPEX-P) 37.5 MG tablet Take 37.5 mg by mouth every morning. 04/15/19  Yes [provider]  PROAIR HFA 108 [phone] Base) MCG/ACT inhaler  09/09/16  Yes [provider]  progesterone (PROMETRIUM) 100 MG capsule Take 1 capsule (100 mg total) by mouth daily. 01/16/19  Yes [name], MD  TIROSINT 125 MCG CAPS TAKE 1 CAPSULE BY MOUTH DAILY EXCEPT TAKE 2 CAPSULES ON SATURDAYS 04/14/19  Yes [provider]  traZODone (DESYREL) 150 MG tablet TAKE 1 TABLET BY MOUTH  EVERY NIGHT AT BEDTIME 01/29/20  Yes [name], MD  TROKENDI XR 100 MG CP24 Take 1 capsule by mouth daily. 03/24/19  Yes [provider]  venlafaxine XR (EFFEXOR-XR) 75 MG 24 hr capsule TAKE 1 CAPSULE BY MOUTH  DAILY 01/29/20  Yes [name], MD  progesterone (PROMETRIUM) 100 MG capsule Take 1 capsule (100 mg total) by mouth daily. 01/08/20   [name], MD  Semaglutide 0.25 or 0.5 MG/DOSE SOPN Inject into the skin.    [provider]    Physical Exam Vitals: Blood pressure 129/80, pulse 69, height 5\' 4"  (1.626 m), weight 144 lb (65.3 kg). Body mass index is 24.72 kg/m.  General: NAD HEENT: normocephalic, anicteric Thyroid: no enlargement, no palpable nodules Pulmonary: No increased work of breathing, CTAB Cardiovascular: RRR, distal pulses 2+ Breast: Breast symmetrical, no tenderness, no palpable nodules or masses, no skin or nipple retraction present, no nipple discharge.  No axillary or supraclavicular lymphadenopathy. Abdomen: NABS, soft, non-tender, non-distended.  Umbilicus without lesions.  No hepatomegaly, splenomegaly or masses palpable. No evidence of hernia  Genitourinary:  External: Normal external female genitalia.  Normal urethral meatus, normal Bartholin's and Skene's glands.    Vagina: Normal vaginal mucosa, no  evidence of prolapse.    Cervix: Grossly normal in appearance, no bleeding  Uterus: Non-enlarged, mobile, normal contour.  No CMT  Adnexa: ovaries non-enlarged, no adnexal masses  Rectal: deferred  Lymphatic: no evidence of inguinal lymphadenopathy Extremities: no edema, erythema, or tenderness Neurologic: Grossly intact Psychiatric: mood appropriate, affect full  Female chaperone present for pelvic and breast  portions of the physical exam  GAD 7 : Generalized Anxiety Score 11/17/2020 03/13/2019 02/13/2019 01/30/2019  Nervous, Anxious, on Edge 2 1 1 1   Control/stop worrying 2 0 1 1  Worry too much - different things 2 0 1 1  Trouble relaxing 2 0 0 1  Restless 2 0 0 1  Easily annoyed or irritable 2 0 1 1  Afraid - awful might happen 0 0 1 1  Total GAD 7 Score 12 1 5 7   Anxiety Difficulty Somewhat difficult Not difficult at all Somewhat difficult Somewhat difficult    Depression screen [facility] 2/9 11/17/2020  05/14/2019 03/13/2019 02/13/2019 01/30/2019  Decreased Interest 2 0 0 1 2  Down, Depressed, Hopeless 2 0 0 1 2  PHQ - 2 Score 4 0 0 2 4  Altered sleeping 2 0 1 2 3   Tired, decreased energy 2 1 1 2 3   Change in appetite 0 0 0 0 3  Feeling bad or failure about yourself  3 1 0 1 2  Trouble concentrating 2 0 0 1 2  Moving slowly or fidgety/restless 2 0 0 0 2  Suicidal thoughts 1 0 0 0 0  PHQ-9 Score 16 2 2 8 19   Difficult doing work/chores Somewhat difficult Not difficult at all Not difficult at all Not difficult at all Very difficult      Assessment: 51 y.o. G2P2 routine annual exam  Plan: Problem List Items Addressed This Visit   None Visit Diagnoses     Encounter for gynecological examination without abnormal finding    -  Primary   Screening for malignant neoplasm of cervix       Relevant Orders   Cytology - PAP   Breast screening       Generalized anxiety disorder       Relevant Medications   busPIRone (BUSPAR) 10 MG tablet   venlafaxine XR (EFFEXOR-XR) 150 MG 24 hr  capsule   Hormone replacement therapy (HRT)           1) Mammogram - recommend yearly screening mammogram.  Mammogram Is up to date  2) STI screening  was notoffered and therefore not obtained  3) ASCCP guidelines and rational discussed.  Patient opts for every 3 years screening interval  4) Osteoporosis  - per USPTF routine screening DEXA at age 36  5) Routine healthcare maintenance including cholesterol, diabetes screening discussed managed by PCP  6) Colonoscopy UTD [facility]  7) Anxiety/Depression - increase effexor to 150mg  daily con't buspar discussed switching to wellbutrin as an option  8) Vasomotor symptoms - HRT refill if poor coverage discussed switching premarin to estrace.  Continue Prometrium for endometrial protection - also some efficacy documented for Effexor in treatment of vasomotor symptoms  7) Return in about 4 weeks (around 12/15/2020) for medication follow up phone or in person.    66  8, MD 2022, [facility] 11/17/2020, 9:48 AM

## 2020-11-22 LAB — CYTOLOGY - PAP
Comment: NEGATIVE
Diagnosis: NEGATIVE
High risk HPV: NEGATIVE

## 2020-12-22 ENCOUNTER — Encounter: Payer: Self-pay | Admitting: Obstetrics and Gynecology

## 2020-12-22 ENCOUNTER — Ambulatory Visit (INDEPENDENT_AMBULATORY_CARE_PROVIDER_SITE_OTHER): Payer: No Typology Code available for payment source | Admitting: Obstetrics and Gynecology

## 2020-12-22 ENCOUNTER — Other Ambulatory Visit: Payer: Self-pay

## 2020-12-22 DIAGNOSIS — F418 Other specified anxiety disorders: Secondary | ICD-10-CM

## 2020-12-22 DIAGNOSIS — F419 Anxiety disorder, unspecified: Secondary | ICD-10-CM

## 2020-12-22 DIAGNOSIS — F32A Depression, unspecified: Secondary | ICD-10-CM

## 2020-12-22 MED ORDER — BUSPIRONE HCL 15 MG PO TABS: 15.0000 mg | ORAL_TABLET | Freq: Two times a day (BID) | ORAL | Status: DC

## 2020-12-22 NOTE — Progress Notes (Signed)
I connected with Molly Bright  on 12/22/2020 at  4:30 PM EDT by telephone and verified that I am speaking with the correct person using two identifiers.   I discussed the limitations, risks, security and privacy concerns of performing an evaluation and management service by telephone and the availability of in person appointments. I also discussed with the patient that there may be a patient responsible charge related to this service. The patient expressed understanding and agreed to proceed.  The patient was at home I spoke with the patient from my workstation phone The names of people involved in this encounter were: Molly Bright , and Molly Bright   Obstetrics & Gynecology Office Visit   Chief Complaint:  Chief Complaint  Patient presents with   Follow-up    Phone visit - feel some better, still thinks anxiety is still high.    History of Present Illness: The patient is a 50 y.o. female presenting follow up for symptoms of anxiety and depression.  The patient is currently taking Effexor XR 150mg  and Buspar 10mg  po bid for the management of her symptoms.  She has not had any recent situational stressors.  She reports symptoms of irritability and social anxiety.  She denies anhedonia, day time somnolence, insomnia, risk taking behavior, agorophobia, feelings of guilt, feelings of worthlessness, suicidal ideation, homicidal ideation, auditory hallucinations, and visual hallucinations. Symptoms have improved since last visit.     The patient does have a pre-existing history of depression and anxiety.  She  does not a prior history of suicide attempts.    Anxiety symptoms are still bothersome to patient  Review of Systems: 10 point review of systems negative unless noted in HPI  Past Medical History:  Past Medical History:  Diagnosis Date   Allergy    Anxiety    Asthma    Depression    Hashimoto's disease     Past Surgical History:  Past Surgical History:   Procedure Laterality Date   ANKLE SURGERY     tendon repair with incison in L calf    CESAREAN SECTION     2x   OOPHORECTOMY Left 08/2015   removed due to torsion with benign mass    Gynecologic History: No LMP recorded. (Menstrual status: Oral contraceptives).  Obstetric History: G2P2  Family History:  Family History  Problem Relation Age of Onset   Cancer Maternal Aunt 66       Colon   Stroke Paternal Grandmother    Cancer Paternal Grandmother        Brain and Kidney   Heart disease Paternal Grandfather     Social History:  Social History   Socioeconomic History   Marital status: Divorced    Spouse name: Not on file   Number of children: Not on file   Years of education: Not on file   Highest education level: Not on file  Occupational History   Not on file  Tobacco Use   Smoking status: Former    Types: Cigarettes   Smokeless tobacco: Never  Vaping Use   Vaping Use: Former   Substances: Flavoring  Substance and Sexual Activity   Alcohol use: No   Drug use: No   Sexual activity: Not Currently    Partners: Male    Birth control/protection: None  Other Topics Concern   Not on file  Social History Narrative   Not on file   Social Determinants of Health   Financial Resource Strain: Not on file  Food Insecurity: Not on file  Transportation Needs: Not on file  Physical Activity: Not on file  Stress: Not on file  Social Connections: Not on file  Intimate Partner Violence: Not on file    Allergies:  Allergies  Allergen Reactions   Latex Rash    Medications: Prior to Admission medications   Medication Sig Start Date End Date Taking? Authorizing Provider  Ascorbic Acid (VITAMIN C) 1000 MG tablet Take 1,000 mg by mouth daily.    [provider]  b complex vitamins capsule Take 1 capsule by mouth daily.    [provider]  busPIRone (BUSPAR) 10 MG tablet Take 1 tablet (10 mg total) by mouth 2 (two) times daily. 11/17/20   Molly Austria, MD  cholecalciferol (VITAMIN D) 1000 units tablet Take 1,000 Units by mouth daily.    [provider]  estrogens, conjugated, (PREMARIN) 0.9 MG tablet TAKE 1 TABLET(0.9 MG) BY MOUTH DAILY 11/17/20   Molly Austria, MD  fluticasone [facility]) 50 MCG/ACT nasal spray Place 2 sprays into both nostrils daily. 04/21/18   Lutricia Feil, PA-C  phentermine (ADIPEX-P) 37.5 MG tablet Take 37.5 mg by mouth every morning. 04/15/19   [provider]  PROAIR HFA 108 [phone] Base) MCG/ACT inhaler  09/09/16   [provider]  progesterone (PROMETRIUM) 100 MG capsule Take 1 capsule (100 mg total) by mouth daily. 11/17/20   Molly Austria, MD  Semaglutide 0.25 or 0.5 MG/DOSE SOPN Inject into the skin.    [provider]  TIROSINT 125 MCG CAPS TAKE 1 CAPSULE BY MOUTH DAILY EXCEPT TAKE 2 CAPSULES ON SATURDAYS 04/14/19   [provider]  traZODone (DESYREL) 150 MG tablet TAKE 1 TABLET BY MOUTH  EVERY NIGHT AT BEDTIME 01/29/20   Molly Austria, MD  TROKENDI XR 100 MG CP24 Take 1 capsule by mouth daily. 03/24/19   [provider]  venlafaxine XR (EFFEXOR-XR) 150 MG 24 hr capsule Take 1 capsule (150 mg total) by mouth daily. 11/17/20   Molly Austria, MD    Physical Exam Vitals: There were no vitals filed for this visit. No LMP recorded. (Menstrual status: Oral contraceptives).  No physical exam as this was a remote telephone visit to promote social distancing during the current COVID-19 Pandemic   GAD 7 : Generalized Anxiety Score 11/17/2020 03/13/2019 02/13/2019 01/30/2019  Nervous, Anxious, on Edge 2 1 1 1   Control/stop worrying 2 0 1 1  Worry too much - different things 2 0 1 1  Trouble relaxing 2 0 0 1  Restless 2 0 0 1  Easily annoyed or irritable 2 0 1 1  Afraid - awful might happen 0 0 1 1  Total GAD 7 Score 12 1 5 7   Anxiety Difficulty Somewhat difficult Not difficult at all Somewhat difficult Somewhat difficult    Depression screen [facility]  2/9 11/17/2020 05/14/2019 03/13/2019  Decreased Interest 2 0 0  Down, Depressed, Hopeless 2 0 0  PHQ - 2 Score 4 0 0  Altered sleeping 2 0 1  Tired, decreased energy 2 1 1   Change in appetite 0 0 0  Feeling bad or failure about yourself  3 1 0  Trouble concentrating 2 0 0  Moving slowly or fidgety/restless 2 0 0  Suicidal thoughts 1 0 0  PHQ-9 Score 16 2 2   Difficult doing work/chores Somewhat difficult Not difficult at all Not difficult at all    Depression screen [facility] 2/9 11/17/2020 05/14/2019 03/13/2019 02/13/2019 01/30/2019  Decreased Interest  2 0 0 1 2  Down, Depressed, Hopeless 2 0 0 1 2  PHQ - 2 Score 4 0 0 2 4  Altered sleeping 2 0 1 2 3   Tired, decreased energy 2 1 1 2 3   Change in appetite 0 0 0 0 3  Feeling bad or failure about yourself  3 1 0 1 2  Trouble concentrating 2 0 0 1 2  Moving slowly or fidgety/restless 2 0 0 0 2  Suicidal thoughts 1 0 0 0 0  PHQ-9 Score 16 2 2 8 19   Difficult doing work/chores Somewhat difficult Not difficult at all Not difficult at all Not difficult at all Very difficult     Assessment: 50 y.o. G2P2 follow up anxiety/depression  Plan: Problem List Items Addressed This Visit   None Visit Diagnoses     Anxiety and depression    -  Primary   Relevant Medications   busPIRone (BUSPAR) 15 MG tablet       1) Anxiety/Depression ncrease buspar to 15mg , and if not adequate improvement told to go to 20mg   2) Thyroid and B12 screen has been obtained previously  3) Telephone time 13:57min  4) Return in about 4 weeks (around 01/19/2021) for Medication follow up.    43, MD, OB/GYN, [facility] 12/22/2020, 4:15 PM

## 2021-01-20 ENCOUNTER — Ambulatory Visit (INDEPENDENT_AMBULATORY_CARE_PROVIDER_SITE_OTHER): Payer: Federal, State, Local not specified - PPO | Admitting: Obstetrics and Gynecology

## 2021-01-20 ENCOUNTER — Other Ambulatory Visit: Payer: Self-pay

## 2021-01-20 DIAGNOSIS — F32A Depression, unspecified: Secondary | ICD-10-CM | POA: Diagnosis not present

## 2021-01-20 DIAGNOSIS — F419 Anxiety disorder, unspecified: Secondary | ICD-10-CM

## 2021-01-20 NOTE — Progress Notes (Signed)
I connected with Molly Bright on 01/20/21 at  4:30 PM EDT by telephone and verified that I am speaking with the correct person using two identifiers.   I discussed the limitations, risks, security and privacy concerns of performing an evaluation and management service by telephone and the availability of in person appointments. I also discussed with the patient that there may be a patient responsible charge related to this service. The patient expressed understanding and agreed to proceed.  The patient was at home I spoke with the patient from my workstation phone The names of people involved in this encounter were: Molly Bright , and Molly Bright   Obstetrics & Gynecology Office Visit   Chief Complaint: No chief complaint on file.   History of Present Illness: The patient is a 50 y.o. female presenting follow up for symptoms of anxiety and depression.  The patient is currently taking  Effexor 75mg  for the management of her symptoms.  She has had any recent situational stressors, falling out with daughter financaial stressors.  She reports symptoms of irritability and social anxiety.  She denies anhedonia, day time somnolence, risk taking behavior, agorophobia, feelings of guilt, feelings of worthlessness, suicidal ideation, homicidal ideation, auditory hallucinations, and visual hallucinations. Symptoms have remained unchanged since last visit.      Review of Systems: review of systems negative unless noted in HPI  Past Medical History:  Past Medical History:  Diagnosis Date   Allergy    Anxiety    Asthma    Depression    Hashimoto's disease     Past Surgical History:  Past Surgical History:  Procedure Laterality Date   ANKLE SURGERY     tendon repair with incison in L calf    CESAREAN SECTION     2x   OOPHORECTOMY Left 08/2015   removed due to torsion with benign mass    Gynecologic History: No LMP recorded. (Menstrual status: Oral  contraceptives).  Obstetric History: G2P2  Family History:  Family History  Problem Relation Age of Onset   Cancer Maternal Aunt 37       Colon   Stroke Paternal Grandmother    Cancer Paternal Grandmother        Brain and Kidney   Heart disease Paternal Grandfather     Social History:  Social History   Socioeconomic History   Marital status: Divorced    Spouse name: Not on file   Number of children: Not on file   Years of education: Not on file   Highest education level: Not on file  Occupational History   Not on file  Tobacco Use   Smoking status: Former    Types: Cigarettes   Smokeless tobacco: Never  Vaping Use   Vaping Use: Former   Substances: Flavoring  Substance and Sexual Activity   Alcohol use: No   Drug use: No   Sexual activity: Not Currently    Partners: Male    Birth control/protection: None  Other Topics Concern   Not on file  Social History Narrative   Not on file   Social Determinants of Health   Financial Resource Strain: Not on file  Food Insecurity: Not on file  Transportation Needs: Not on file  Physical Activity: Not on file  Stress: Not on file  Social Connections: Not on file  Intimate Partner Violence: Not on file    Allergies:  Allergies  Allergen Reactions   Latex Rash    Medications: Prior to Admission  medications   Medication Sig Start Date End Date Taking? Authorizing Provider  Ascorbic Acid (VITAMIN C) 1000 MG tablet Take 1,000 mg by mouth daily.    [provider]  b complex vitamins capsule Take 1 capsule by mouth daily.    [provider]  busPIRone (BUSPAR) 15 MG tablet Take 1 tablet (15 mg total) by mouth 2 (two) times daily. 12/22/20   Molly Austria, MD  cholecalciferol (VITAMIN D) 1000 units tablet Take 1,000 Units by mouth daily.    [provider]  estrogens, conjugated, (PREMARIN) 0.9 MG tablet TAKE 1 TABLET(0.9 MG) BY MOUTH DAILY 11/17/20   Molly Austria, MD  fluticasone  [facility]) 50 MCG/ACT nasal spray Place 2 sprays into both nostrils daily. 04/21/18   Lutricia Feil, PA-C  PROAIR HFA 108 [phone] Base) MCG/ACT inhaler  09/09/16   [provider]  progesterone (PROMETRIUM) 100 MG capsule Take 1 capsule (100 mg total) by mouth daily. 11/17/20   Molly Austria, MD  Semaglutide 0.25 or 0.5 MG/DOSE SOPN Inject into the skin.    [provider]  TIROSINT 125 MCG CAPS TAKE 1 CAPSULE BY MOUTH DAILY EXCEPT TAKE 2 CAPSULES ON SATURDAYS 04/14/19   [provider]  traZODone (DESYREL) 150 MG tablet TAKE 1 TABLET BY MOUTH  EVERY NIGHT AT BEDTIME 01/29/20   Molly Austria, MD  TROKENDI XR 100 MG CP24 Take 1 capsule by mouth daily. 03/24/19   [provider]  venlafaxine XR (EFFEXOR-XR) 150 MG 24 hr capsule Take 1 capsule (150 mg total) by mouth daily. 11/17/20   Molly Austria, MD    Physical Exam Vitals: There were no vitals filed for this visit. No LMP recorded. (Menstrual status: Oral contraceptives).  No physical exam as this was a remote telephone visit to promote social distancing during the current COVID-19 Pandemic   GAD 7 : Generalized Anxiety Score 01/20/2021 11/17/2020 03/13/2019 02/13/2019  Nervous, Anxious, on Edge 1 2 1 1   Control/stop worrying 1 2 0 1  Worry too much - different things 1 2 0 1  Trouble relaxing 1 2 0 0  Restless 1 2 0 0  Easily annoyed or irritable 2 2 0 1  Afraid - awful might happen 1 0 0 1  Total GAD 7 Score 8 12 1 5   Anxiety Difficulty Somewhat difficult Somewhat difficult Not difficult at all Somewhat difficult    Depression screen [facility] 2/9 11/17/2020 05/14/2019 03/13/2019  Decreased Interest 2 0 0  Down, Depressed, Hopeless 2 0 0  PHQ - 2 Score 4 0 0  Altered sleeping 2 0 1  Tired, decreased energy 2 1 1   Change in appetite 0 0 0  Feeling bad or failure about yourself  3 1 0  Trouble concentrating 2 0 0  Moving slowly or fidgety/restless 2 0 0  Suicidal thoughts 1 0 0  PHQ-9 Score 16 2 2    Difficult doing work/chores Somewhat difficult Not difficult at all Not difficult at all    Depression screen [facility] 2/9 01/20/2021 11/17/2020 05/14/2019 03/13/2019 02/13/2019  Decreased Interest 1 2 0 0 1  Down, Depressed, Hopeless 1 2 0 0 1  PHQ - 2 Score 2 4 0 0 2  Altered sleeping 2 2 0 1 2  Tired, decreased energy 2 2 1 1 2   Change in appetite 0 0 0 0 0  Feeling bad or failure about yourself  0 3 1 0 1  Trouble concentrating 3 2 0 0 1  Moving slowly  or fidgety/restless 2 2 0 0 0  Suicidal thoughts 0 1 0 0 0  PHQ-9 Score 11 16 2 2 8   Difficult doing work/chores Somewhat difficult Somewhat difficult Not difficult at all Not difficult at all Not difficult at all     Assessment: 50 y.o. G2P2 anxiety and depression follow up  Plan: Problem List Items Addressed This Visit   None Visit Diagnoses     Anxiety and depression    -  Primary       1) Anxiety/Depression - continue current dose of Effexor XR 150mg  scale improved despite  recent acute  stressor  2) Thyroid and B12 screen has been obtained previously  3) Telephone Tim: 9:03 min  4) Return in about 8 weeks (around 03/17/2021) for medication follow up.    , MD, 13/02/2021 OB/GYN, [facility] 01/20/2021, 4:31 PM

## 2021-02-03 ENCOUNTER — Other Ambulatory Visit: Payer: Self-pay | Admitting: Obstetrics and Gynecology

## 2021-03-02 ENCOUNTER — Encounter: Payer: Self-pay | Admitting: Obstetrics and Gynecology

## 2021-03-02 ENCOUNTER — Ambulatory Visit (INDEPENDENT_AMBULATORY_CARE_PROVIDER_SITE_OTHER): Payer: Federal, State, Local not specified - PPO | Admitting: Obstetrics and Gynecology

## 2021-03-02 ENCOUNTER — Other Ambulatory Visit: Payer: Self-pay

## 2021-03-02 DIAGNOSIS — F419 Anxiety disorder, unspecified: Secondary | ICD-10-CM | POA: Diagnosis not present

## 2021-03-02 DIAGNOSIS — F32A Depression, unspecified: Secondary | ICD-10-CM

## 2021-03-02 MED ORDER — BUSPIRONE HCL 30 MG PO TABS: 30.0000 mg | ORAL_TABLET | Freq: Two times a day (BID) | ORAL | 11 refills | Status: AC

## 2021-03-02 MED ORDER — VENLAFAXINE HCL ER 150 MG PO CP24: 150.0000 mg | ORAL_CAPSULE | Freq: Every day | ORAL | 11 refills | Status: AC
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
## 2021-04-13 ENCOUNTER — Telehealth: Payer: Self-pay

## 2021-04-13 ENCOUNTER — Encounter: Payer: Self-pay | Admitting: Obstetrics and Gynecology

## 2021-04-13 ENCOUNTER — Other Ambulatory Visit: Payer: Self-pay

## 2021-04-13 ENCOUNTER — Ambulatory Visit (INDEPENDENT_AMBULATORY_CARE_PROVIDER_SITE_OTHER): Payer: Federal, State, Local not specified - PPO | Admitting: Obstetrics and Gynecology

## 2021-04-13 DIAGNOSIS — F32A Depression, unspecified: Secondary | ICD-10-CM | POA: Diagnosis not present

## 2021-04-13 DIAGNOSIS — F419 Anxiety disorder, unspecified: Secondary | ICD-10-CM

## 2021-04-13 NOTE — Telephone Encounter (Signed)
Called for phone visit, LMOM to ret call.

## 2021-04-13 NOTE — Progress Notes (Signed)
I connected with Molly Bright n 04/13/21 at  4:10 PM EST by telephone and verified that I am speaking with the correct person using two identifiers.   I discussed the limitations, risks, security and privacy concerns of performing an evaluation and management service by telephone and the availability of in person appointments. I also discussed with the patient that there may be a patient responsible charge related to this service. The patient expressed understanding and agreed to proceed.  The patient was at home I spoke with the patient from my workstation phone The names of people involved in this encounter were: Clydine L Dusek , and [name]   Obstetrics & Gynecology Office Visit   Chief Complaint:  Chief Complaint  Patient presents with   Follow-up    Phone visit - no concerns.    History of Present Illness: The patient is a 50 y.o. female presenting follow up for symptoms of anxiety and depression.  The patient is currently taking Effexor XR 150mg  and buspar 30mg  po bid for the management of her symptoms.  She has not had any recent situational stressors.  She reports symptoms have remained stable and improved.  She denies risk taking behavior, agorophobia, feelings of worthlessness, suicidal ideation, homicidal ideation, auditory hallucinations, and visual hallucinations. Symptoms have remained unchanged since last visit.     The patient does have a pre-existing history of depression and anxiety.  She  does not a prior history of suicide attempts.    Review of Systems: Review of Systems  Constitutional: Negative.   Gastrointestinal:  Negative for nausea.  Neurological:  Negative for headaches.  Psychiatric/Behavioral:  Negative for depression, hallucinations, substance abuse and suicidal ideas. The patient is nervous/anxious.     Past Medical History:  Past Medical History:  Diagnosis Date   Allergy    Anxiety    Asthma    Depression    Hashimoto's disease      Past Surgical History:  Past Surgical History:  Procedure Laterality Date   ANKLE SURGERY     tendon repair with incison in L calf    CESAREAN SECTION     2x   OOPHORECTOMY Left 08/2015   removed due to torsion with benign mass    Gynecologic History: No LMP recorded. (Menstrual status: Oral contraceptives).  Obstetric History: G2P2  Family History:  Family History  Problem Relation Age of Onset   Cancer Maternal Aunt 56       Colon   Stroke Paternal Grandmother    Cancer Paternal Grandmother        Brain and Kidney   Heart disease Paternal Grandfather     Social History:  Social History   Socioeconomic History   Marital status: Divorced    Spouse name: Not on file   Number of children: Not on file   Years of education: Not on file   Highest education level: Not on file  Occupational History   Not on file  Tobacco Use   Smoking status: Former    Types: Cigarettes   Smokeless tobacco: Never  Vaping Use   Vaping Use: Former   Substances: Flavoring  Substance and Sexual Activity   Alcohol use: No   Drug use: No   Sexual activity: Not Currently    Partners: Male    Birth control/protection: None  Other Topics Concern   Not on file  Social History Narrative   Not on file   Social Determinants of Health   Financial  Resource Strain: Not on file  Food Insecurity: Not on file  Transportation Needs: Not on file  Physical Activity: Not on file  Stress: Not on file  Social Connections: Not on file  Intimate Partner Violence: Not on file    Allergies:  Allergies  Allergen Reactions   Latex Rash    Medications: Prior to Admission medications   Medication Sig Start Date End Date Taking? Authorizing Provider  Ascorbic Acid (VITAMIN C) 1000 MG tablet Take 1,000 mg by mouth daily.    [provider]  b complex vitamins capsule Take 1 capsule by mouth daily.    [provider]  busPIRone (BUSPAR) 30 MG tablet Take 1 tablet (30 mg  total) by mouth 2 (two) times daily. 03/02/21   [name], MD  cholecalciferol (VITAMIN D) 1000 units tablet Take 1,000 Units by mouth daily.    [provider]  estrogens, conjugated, (PREMARIN) 0.9 MG tablet TAKE 1 TABLET(0.9 MG) BY MOUTH DAILY 11/17/20   [name], MD  fluticasone [facility]) 50 MCG/ACT nasal spray Place 2 sprays into both nostrils daily. 04/21/18   Lutricia Feil, PA-C  PROAIR HFA 108 [phone] Base) MCG/ACT inhaler  09/09/16   [provider]  progesterone (PROMETRIUM) 100 MG capsule Take 1 capsule (100 mg total) by mouth daily. 11/17/20   [name], MD  Semaglutide 0.25 or 0.5 MG/DOSE SOPN Inject into the skin.    [provider]  TIROSINT 125 MCG CAPS TAKE 1 CAPSULE BY MOUTH DAILY EXCEPT TAKE 2 CAPSULES ON SATURDAYS 04/14/19   [provider]  traZODone (DESYREL) 150 MG tablet TAKE 1 TABLET BY MOUTH  EVERY NIGHT AT BEDTIME 01/29/20   [name], MD  TROKENDI XR 100 MG CP24 Take 1 capsule by mouth daily. 03/24/19   [provider]  venlafaxine XR (EFFEXOR-XR) 150 MG 24 hr capsule Take 1 capsule (150 mg total) by mouth daily with breakfast. 03/02/21   [name], MD    Physical Exam Vitals: There were no vitals filed for this visit. No LMP recorded. (Menstrual status: Oral contraceptives).  No physical exam as this was a remote telephone visit to promote social distancing during the current COVID-19 Pandemic   GAD 7 : Generalized Anxiety Score 04/13/2021 01/20/2021 11/17/2020 03/13/2019  Nervous, Anxious, on Edge 1 1 2 1   Control/stop worrying 1 1 2  0  Worry too much - different things 2 1 2  0  Trouble relaxing 1 1 2  0  Restless 1 1 2  0  Easily annoyed or irritable 1 2 2  0  Afraid - awful might happen 2 1 0 0  Total GAD 7 Score 9 8 12 1   Anxiety Difficulty Somewhat difficult Somewhat difficult Somewhat difficult Not difficult at all    Depression screen [facility] 2/9 04/13/2021 01/20/2021 11/17/2020   Decreased Interest 1 1 2   Down, Depressed, Hopeless 1 1 2   PHQ - 2 Score 2 2 4   Altered sleeping 0 2 2  Tired, decreased energy 3 2 2   Change in appetite 0 0 0  Feeling bad or failure about yourself  2 0 3  Trouble concentrating 2 3 2   Moving slowly or fidgety/restless 1 2 2   Suicidal thoughts 1 0 1  PHQ-9 Score 11 11 16   Difficult doing work/chores Somewhat difficult Somewhat difficult Somewhat difficult    Depression screen [facility] 2/9 04/13/2021 01/20/2021 11/17/2020 05/14/2019 03/13/2019  Decreased Interest 1 1 2  0 0  Down, Depressed, Hopeless 1 1 2  0 0  PHQ - 2 Score 2 2 4  0 0  Altered sleeping 0 2 2 0 1  Tired, decreased energy 3 2 2 1 1   Change in appetite 0 0 0 0 0  Feeling bad or failure about yourself  2 0 3 1 0  Trouble concentrating 2 3 2  0 0  Moving slowly or fidgety/restless 1 2 2  0 0  Suicidal thoughts 1 0 1 0 0  PHQ-9 Score 11 11 16 2 2   Difficult doing work/chores Somewhat difficult Somewhat difficult Somewhat difficult Not difficult at all Not difficult at all     Assessment: 50 y.o. G2P2 follow up anxiety/depression  Plan: Problem List Items Addressed This Visit   None Visit Diagnoses     Anxiety and depression    -  Primary       1) Anxieyt/Depression  - continue Effexor XR 150mg  po daily and Buspar 30mg  po bid  2) Telephone time 8:55min  3) Return in about 7 months (around 11/11/2021) for annual exam.    , MD, OB/GYN, [facility] 04/13/2021, 4:31 PM

## 2021-07-07 ENCOUNTER — Telehealth: Payer: Self-pay

## 2021-07-07 NOTE — Telephone Encounter (Signed)
Mandy from [facility] calling; pt has just tx'd to them; needs ozempic, buspar, and progesterone switched to them.  [phone] ?

## 2021-07-07 NOTE — Telephone Encounter (Signed)
Called pharm and spoke c [place]; rxs are from [facility] which was bought out by [facility]; [facility] doesn't have record of rxs; adv thyroid rx is from [facility]; do not ozempic; buspar was [facility] and was #60 c 11 refills; progesterone is [facility] #90 c 3 refills. Angelica Chessman will see what she can get from [facility] and maybe [facility].  Adv pt may need to come in for annual to get this straightened out as we haven't done on in a few years. ?

## 2021-07-07 NOTE — Telephone Encounter (Signed)
That can be done pharmacy to pharmacy
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
## 2021-07-20 ENCOUNTER — Telehealth: Payer: Self-pay

## 2021-07-20 MED ORDER — PROGESTERONE MICRONIZED 100 MG PO CAPS: 100.0000 mg | ORAL_CAPSULE | Freq: Every day | ORAL | 1 refills | Status: DC

## 2021-07-20 NOTE — Telephone Encounter (Signed)
Pt calling; needs another rx sent to new pharm.  [phone]  Pt states she needs progesterone 100mg  sent to [facility]; adv will send it in. ?

## 2021-10-12 ENCOUNTER — Other Ambulatory Visit: Payer: Self-pay | Admitting: Licensed Practical Nurse
